# Patient Record
Sex: Male | Born: 1980 | Race: White | Hispanic: No | Marital: Married
Health system: Midwestern US, Community
[De-identification: ages and names within clinical notes are randomized; demographics above are authoritative.]

## PROBLEM LIST (undated history)

## (undated) DIAGNOSIS — J302 Other seasonal allergic rhinitis: Secondary | ICD-10-CM

## (undated) DIAGNOSIS — F1020 Alcohol dependence, uncomplicated: Secondary | ICD-10-CM

## (undated) DIAGNOSIS — I10 Essential (primary) hypertension: Secondary | ICD-10-CM

## (undated) DIAGNOSIS — K219 Gastro-esophageal reflux disease without esophagitis: Secondary | ICD-10-CM

## (undated) DIAGNOSIS — I471 Supraventricular tachycardia, unspecified: Secondary | ICD-10-CM

## (undated) HISTORY — PX: TONSILLECTOMY: SUR1361

## (undated) HISTORY — DX: Essential (primary) hypertension: I10

## (undated) HISTORY — PX: ADENOIDECTOMY: SUR15

## (undated) HISTORY — DX: Alcohol dependence, uncomplicated: F10.20

## (undated) HISTORY — PX: HERNIA REPAIR: SHX51

## (undated) HISTORY — PX: WISDOM TOOTH EXTRACTION: SHX21

---

## 2008-06-10 ENCOUNTER — Emergency Department (HOSPITAL_COMMUNITY): Admission: EM | Admit: 2008-06-10 | Discharge: 2008-06-10 | Payer: Self-pay | Admitting: Family Medicine

## 2009-05-31 ENCOUNTER — Ambulatory Visit: Payer: Self-pay | Admitting: Family Medicine

## 2009-05-31 LAB — CONVERTED CEMR LAB
Blood in Urine, dipstick: NEGATIVE
Glucose, Urine, Semiquant: NEGATIVE
Ketones, urine, test strip: NEGATIVE
Specific Gravity, Urine: <1.005

## 2009-12-21 ENCOUNTER — Ambulatory Visit: Payer: Self-pay | Admitting: Family Medicine

## 2009-12-21 DIAGNOSIS — G4700 Insomnia, unspecified: Secondary | ICD-10-CM

## 2009-12-21 DIAGNOSIS — R0989 Other specified symptoms and signs involving the circulatory and respiratory systems: Secondary | ICD-10-CM

## 2009-12-21 DIAGNOSIS — R0609 Other forms of dyspnea: Secondary | ICD-10-CM

## 2010-01-22 ENCOUNTER — Ambulatory Visit: Payer: Self-pay | Admitting: Family Medicine

## 2010-01-22 DIAGNOSIS — Z87891 Personal history of nicotine dependence: Secondary | ICD-10-CM

## 2010-04-03 NOTE — Assessment & Plan Note (Signed)
Summary: COLD   Vital Signs:  Patient Profile:   30 Years Old Male CC:      Headache, nausea, body aches, swollen lymph nodes in groin x 3 days Height:     70 inches Weight:      185 pounds O2 Sat:      99 % O2 treatment:    Room Air Temp:     99.4 degrees F oral Pulse rate:   87 / minute Pulse rhythm:   regular Resp:     12 per minute BP sitting:   134 / 75  (right arm) Cuff size:   regular  Vitals Entered By: Avel Sensor, CMA                  Prior Medication List:  No prior medications documented  Current Allergies: No known allergies History of Present Illness Chief Complaint: Headache, nausea, body aches, swollen lymph nodes in groin x 3 days History of Present Illness: As above. Every thing started on Monday night w/body aches and chills. Slept extra felt better and then got worse as the day progressed w/body aches lower body achey and headaches.  Current Problems: INFLUENZA (ICD-487.8) VIRAL INFECTION (ICD-079.99) VIRAL INFECTION (ICD-079.99)   Current Meds TAMIFLU 75 MG CAPS (OSELTAMIVIR PHOSPHATE) Take one capsule by mouth twice a day PROMETHAZINE HCL 25 MG  TABS (PROMETHAZINE HCL) sig 1 by mouth q6-8hrs prn MOBIC 7.5 MG TABS (MELOXICAM) sig 1 by mouth q day  REVIEW OF SYSTEMS Constitutional Symptoms       Complains of fever, chills, and fatigue.     Denies night sweats, weight loss, and weight gain.  Eyes       Denies change in vision, eye pain, eye discharge, glasses, contact lenses, and eye surgery. Ear/Nose/Throat/Mouth       Denies hearing loss/aids, change in hearing, ear pain, ear discharge, dizziness, frequent runny nose, frequent nose bleeds, sinus problems, sore throat, hoarseness, and tooth pain or bleeding.  Respiratory       Denies dry cough, productive cough, wheezing, shortness of breath, asthma, bronchitis, and emphysema/COPD.  Cardiovascular       Denies murmurs, chest pain, and tires easily with exhertion.    Gastrointestinal  Complains of stomach pain, nausea/vomiting, and constipation.      Denies diarrhea, blood in bowel movements, and indigestion.      Comments: nausea but no vomiting Genitourniary       Denies painful urination, kidney stones, and loss of urinary control. Neurological       Denies paralysis, seizures, and fainting/blackouts. Musculoskeletal       Complains of muscle pain, joint pain, and joint stiffness.      Denies decreased range of motion, redness, swelling, muscle weakness, and gout.  Skin       Denies bruising, unusual mles/lumps or sores, and hair/skin or nail changes.  Psych       Denies mood changes, temper/anger issues, anxiety/stress, speech problems, depression, and sleep problems.  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 Mother, Heathy Father, Diabetic  Social History: Reviewed history and no changes required. 1/2 ppd smoker, 15 yrs ETOH-yes No [employer], [employer] Physical Exam General appearance: well developed, well nourished, mild distress Head: normocephalic, atraumatic  Ears: normal, no lesions or deformities Nasal: swollen red turbinates with congestion Oral/Pharynx: tongue normal, posterior pharynx without erythema or exudate Neck: supple,anterior lymphadenopathy present Abdomen: soft, non-tender without obvious organomegaly Extremities: inguinal adenopathy tenderness pulses intact Skin: no obvious rashes or lesions MSE: oriented to time, place, and person U/A neg flu neg Assessment New Problems: INFLUENZA (ICD-487.8) VIRAL INFECTION (ICD-079.99) VIRAL INFECTION (ICD-079.99)  flu like syndrome neg flu test  Patient  Education: Patient and/or caregiver instructed in the following: rest fluids and Tylenol.  Plan New Medications/Changes: MOBIC 7.5 MG TABS (MELOXICAM) sig 1 by mouth q day  #20 x 0, 05/31/2009, Hassan Rowan MD PROMETHAZINE HCL 25 MG  TABS (PROMETHAZINE HCL) sig 1 by mouth q6-8hrs prn  #12 x 0, 05/31/2009, Hassan Rowan MD TAMIFLU 75 MG CAPS (OSELTAMIVIR PHOSPHATE) Take one capsule by mouth twice a day  #10 x 0, 05/31/2009, Hassan Rowan MD  New Orders: UA Dipstick w/o Micro (manual) [81002] Flu A+B [87400] New Patient Level III [id number] Planning Comments:   as below  Follow Up: Follow up in 2-3 days if no improvement, Follow up on an as needed basis, Follow up with Primary Physician Work/School Excuse: Return to work/school in 3 days  The patient and/or caregiver has been counseled thoroughly with regard to medications prescribed including dosage, schedule, interactions, rationale for use, and possible side effects and they verbalize understanding.  Diagnoses and expected course of recovery discussed and will return if not improved as expected or if the condition worsens. Patient and/or caregiver verbalized understanding.  Prescriptions: MOBIC 7.5 MG TABS (MELOXICAM) sig 1 by mouth q day  #20 x 0   Entered and Authorized by:   Hassan Rowan MD   Signed by:   Hassan Rowan MD on 05/31/2009   Method used:   Print then Give to Patient   RxID:   [id number] PROMETHAZINE HCL 25 MG  TABS (PROMETHAZINE HCL) sig 1 by mouth q6-8hrs prn  #12 x 0   Entered and Authorized by:   Hassan Rowan MD   Signed by:   Hassan Rowan MD on 05/31/2009   Method used:   Print then Give to Patient   RxID:   [id number] TAMIFLU 75 MG CAPS (OSELTAMIVIR PHOSPHATE) Take one capsule by mouth twice a day  #10 x 0   Entered and Authorized by:   Hassan Rowan MD   Signed by:   Hassan Rowan MD on 05/31/2009   Method used:   Print then Give to Patient   RxID:   [id number]   Patient Instructions: 1)  Even though flu  test is negative will treat this as flu. 2)  Please schedule a follow-up appointment as needed. 3)  Please schedule an appointment with your primary doctor in :3-4 days if not better 4)  Tobacco is very bad for your health and your loved ones! You Should stop smoking!. 5)  Stop Smoking Tips: Choose a Quit date. Cut down before the Quit date. decide what you will do as a substitute when you feel the urge to smoke(gum,toothpick,exercise). 6)  Recommended remaining out of work for next 3 days return 06/02/2009  Laboratory Results   Urine Tests  Date/Time Received: May 31, 2009 1:05 PM  Date/Time Reported: May 31, 2009 1:05 PM   Routine Urinalysis   Color: yellow Appearance: Clear Glucose: negative   (Normal Range: Negative) Bilirubin: negative   (Normal Range: Negative) Ketone: negative   (Normal Range: Negative) Spec. Gravity: <1.005   (Normal Range: 1.003-1.035) Blood: negative   (  Normal Range: Negative) pH: 6.5   (Normal Range: 5.0-8.0) Protein: negative   (Normal Range: Negative) Urobilinogen: 1.0   (Normal Range: 0-1) Nitrite: negative   (Normal Range: Negative) Leukocyte Esterace: negative   (Normal Range: Negative)

## 2010-04-03 NOTE — Assessment & Plan Note (Signed)
Summary: NOV: snoring, dry throat   Vital Signs:  Patient profile:   30 year old male Height:      70 inches Weight:      191 pounds BMI:     27.50 Pulse rate:   80 / minute BP sitting:   130 / 77  (right arm) Cuff size:   regular  Vitals Entered By: Avon Gully CMA, Duncan Dull) (December 21, 2009 10:32 AM) CC: NP-est care   CC:  NP-est care.  History of Present Illness: Since quit smoking has alot of trouble sleepiing.  Roof of his mouth is very dry. Snores every night.  No breathholding. No AM HA.  Has happened periodically throughout his life. Started with quitting smoking. No excess fatigue or daytime sleepiness. Taking Zyrtec only for the lat week for a cold.  .Says it is only teh back of roof of his mouth. No sig congestions or breathing thourgh his mouth. NO alleregies. Say smoking a cig is the only thinks that helps his snoring adn his dry mouth sensation. No famly hx of sjogrens. Deneis any dry eye hx.    Habits & Providers  Alcohol-Tobacco-Diet     Alcohol drinks/day: <1     Tobacco Status: quit     Year Quit: 2010  Exercise-Depression-Behavior     Does Patient Exercise: yes     STD Risk: never     Drug Use: never     Seat Belt Use: always  Allergies: No Known Drug Allergies  Past History:  Past Medical History: HTN, DM, alcoholism  Family History: Mother, Jack Quarto, smoker Father - Diabetic, HTN, alcoholism.   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 Has one dhild.  1/2 ppd smoker, 15 yrs. Quit smoking 11/23/2008 ETOH-yes No [employer], [address] Former Smoker 6 caffeinated drinks daily.  Smoking Status:  quit Does Patient Exercise:  yes STD Risk:  never Drug Use:  never Seat Belt Use:  always  Review of Systems       No fever/sweats/weakness, unexplained weight loss/gain.  No vison changes.  No difficulty hearing/ringing in ears, hay fever/allergies.  No chest pain/discomfort, palpitations.  No Br  lump/nipple discharge.  No cough/wheeze.  No blood in BM, nausea/vomiting/diarrhea.  No nighttime urination, leaking urine, unusual vaginal bleeding, discharge (penis or vagina).  No muscle/joint pain. No rash, change in mole.  No HA, memory loss.  No anxiety, sleep d/o, depression.  No easy bruising/bleeding, unexplained lump   Physical Exam  General:  Well-developed,well-nourished,in no acute distress; alert,appropriate and cooperative throughout examination Head:  Normocephalic and atraumatic without obvious abnormalities. No apparent alopecia or balding. Eyes:  No corneal or conjunctival inflammation noted. EOMI. Perrla.  Ears:  External ear exam shows no significant lesions or deformities.  Otoscopic examination reveals clear canals, tympanic membranes are intact bilaterally without bulging, retraction, inflammation or discharge. Hearing is grossly normal bilaterally. Nose:  External nasal examination shows no deformity or inflammation.  Mouth:  Oral mucosa and oropharynx without lesions or exudates.  Teeth in good repair. Soft palate looks very moist.  Neck:  No deformities, masses, or tenderness noted. NO TM.  Lungs:  Normal respiratory effort, chest expands symmetrically. Lungs are clear to auscultation, no crackles or wheezes. Heart:  Normal rate and regular rhythm. S1 and S2 normal without gallop, murmur, click, rub or other extra sounds. Skin:  no rashes.   Cervical Nodes:  No lymphadenopathy noted Psych:  Cognition and judgment appear intact. Alert and cooperative with  normal attention span and concentration. No apparent delusions, illusions, hallucinations   Impression & Recommendations:  Problem # 1:  SNORING (ICD-786.09) Could be a sign of OSA but no AM HA or daytime excessive sleepiness.  But snoring is almost nightly. He says it is not positions and he has had his tonsil sremoved. I would suspet the snoring is causing the sensation over teh soft palate as the tissues themselve  are very moist wito no lesions, etc.  Dsicussed could consider a sleep studay or referral to sleep med specialist. For short term - tiral of nasal steroid for one mont h and then return to clinic and see if helping or not.  Encourage him to not start smoking again.   Problem # 2:  INSOMNIA (ICD-780.52) Unclear if related to his snoring or not. Snoring may be waking him up but not sure. Also he does shift work working in [employer] so likely this and his caffeine have a rgreat effect. Dsicussed sleep hygiene. Can also consider a sleep aid if hygeiene alone not helping.   Complete Medication List: 1)  Flonase 50 Mcg/act Susp (Fluticasone propionate) .... 2 sprays in each nostril once a day.  Patient Instructions: 1)  Try the nasal spray for one month and then follow up.  Prescriptions: FLONASE 50 MCG/ACT SUSP (FLUTICASONE PROPIONATE) 2 sprays in each nostril once a day.  #1 x 1   Entered and Authorized by:   Nani Gasser MD   Signed by:   Nani Gasser MD on 12/21/2009   Method used:   [facility] to        [phone][facility] ([facility]ail[facility]       [address]       [place], [place]  [address]       Ph: [phone] or [phone]       Fax: [phone]   RxID:   [id number]    Orders Added: 1)  New Patient Level III [99203]   Immunization History:  Influenza Immunization History:    Influenza:  historical (12/11/2009)   Immunization History:  Influenza Immunization History:    Influenza:  Historical (12/11/2009)   Immunization History:  Influenza Immunization History:    Influenza:  historical (12/11/2009)

## 2010-04-03 NOTE — Assessment & Plan Note (Signed)
Summary: CPE   Vital Signs:  Patient profile:   30 year old male Height:      70 inches Weight:      191 pounds Pulse rate:   83 / minute BP sitting:   135 / 78  (right arm) Cuff size:   regular  Vitals Entered By: Avon Gully CMA, Duncan Dull) (January 22, 2010 10:09 AM) CC: CPE, Hypertension Management   CC:  CPE and Hypertension Management.  History of Present Illness: CPE.  Dong well. He had quit smoking for about a months but then started again about 2 weeks ago. Then stopped again about 2 days ago. He thinks he may need help quitting smoking and would like to discuss those options.   He was on teh flonase for snoring. Didn't really help and started causing nosebleeds so stopped it.   Hypertension History:      Negative major cardiovascular risk factors include male age less than 58 years old and non-tobacco-user status.     Current Medications (verified): 1)  Flonase 50 Mcg/act Susp (Fluticasone Propionate) .... 2 Sprays in Each Nostril Once A Day.  Allergies (verified): No Known Drug Allergies  Comments:  Nurse/Medical Assistant: The patient's medications and allergies were reviewed with the patient and were updated in the Medication and Allergy Lists. Avon Gully CMA, Duncan Dull) (January 22, 2010 10:09 AM)  Past History:  Past Medical History: Last updated: 12/21/2009 HTN, DM, alcoholism  Family History: Last updated: 12/21/2009 Mother, Heathy, smoker Father - Diabetic, HTN, alcoholism.   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  1/2 ppd smoker, 15 yrs. Quit smoking 11/23/2008 ETOH-yes No [employer], [address] Former Smoker 6 caffeinated drinks daily.   Past Surgical History: Inguinal herniorrhaphy Tonsillectomy and adenoids  Review of Systems  The patient denies anorexia, fever, weight loss, weight gain, vision loss, decreased hearing, hoarseness, chest  pain, syncope, dyspnea on exertion, peripheral edema, prolonged cough, headaches, hemoptysis, abdominal pain, melena, hematochezia, severe indigestion/heartburn, hematuria, incontinence, genital sores, muscle weakness, suspicious skin lesions, transient blindness, difficulty walking, depression, unusual weight change, abnormal bleeding, and enlarged lymph nodes.    Physical Exam  General:  Well-developed,well-nourished,in no acute distress; alert,appropriate and cooperative throughout examination Head:  Normocephalic and atraumatic without obvious abnormalities. No apparent alopecia or balding. Eyes:  No corneal or conjunctival inflammation noted. EOMI. Perrla.  Ears:  External ear exam shows no significant lesions or deformities.  Otoscopic examination reveals clear canals, tympanic membranes are intact bilaterally without bulging, retraction, inflammation or discharge. Hearing is grossly normal bilaterally. Nose:  External nasal examination shows no deformity or inflammation. Nasal mucosa are pink and moist without lesions or exudates. Mouth:  Oral mucosa and oropharynx without lesions or exudates.  Teeth in good repair. Neck:  No deformities, masses, or tenderness noted. Chest Wall:  No deformities, masses, tenderness or gynecomastia noted. Lungs:  Normal respiratory effort, chest expands symmetrically. Lungs are clear to auscultation, no crackles or wheezes. Heart:  Normal rate and regular rhythm. S1 and S2 normal without gallop, murmur, click, rub or other extra sounds. Abdomen:  Bowel sounds positive,abdomen soft and non-tender without masses, organomegaly or hernias noted. Msk:  No deformity or scoliosis noted of thoracic or lumbar spine.   Pulses:  R and L carotid,radial,dorsalis pedis and posterior tibial pulses are full and equal bilaterally Extremities:  No clubbing, cyanosis, edema, or deformity noted with normal full range of motion of all joints.   Neurologic:  No cranial nerve  deficits noted. Station and gait are normal. DTRs are symmetrical throughout. Sensory, motor and coordinative functions appear intact. Skin:  no rashes.   Cervical Nodes:  No lymphadenopathy noted Psych:  Cognition and judgment appear intact. Alert and cooperative with normal attention span and concentration. No apparent delusions, illusions, hallucinations   Impression & Recommendations:  Problem # 1:  PHYSICAL EXAMINATION (ICD-V70.0) Doing well. He has recently stoppeding drinking and smoking which is wonders.   Deu for screening labs Immunizations are uptodate.  Orders: T-Comprehensive Metabolic Panel [id number]) T-Lipid Profile [phone])  Problem # 2:  TOBACCO ABUSE (ICD-305.1) Discussed Chantix vx Wellbutrin. How meds works and possible side effects. He decided to use the wellbutrin.  Rx sent.   Complete Medication List: 1)  Budeprion Sr 150 Mg Xr12h-tab (Bupropion hcl) .... One a day for 3 days then increase to two times a day  Hypertension Assessment/Plan:      The patient's hypertensive risk group is category A: No risk factors and no target organ damage.  Today's blood pressure is 135/78.    Patient Instructions: 1)  Great jobs on improving your health. 2)  Can start the buproprion to help you stop smoking.   3)  Call if any concerns 4)  You can go to the lab anytime Monday through Friday 8AM to 5PM, you need to fast for 8 hours.  Prescriptions: BUDEPRION SR 150 MG XR12H-TAB (BUPROPION HCL) one a day for 3 days then increase to two times a day  #60 x 2   Entered and Authorized by:   Nani Gasser MD   Signed by:   Nani Gasser MD on 01/22/2010   Method used:   [facility] to        [facility] [phone][facility] (retail)       [address] [place], [place]  [address]       Ph: [phone] or [phone]       Fax: [phone]   RxID:   [id number]    Orders Added: 1)  Est. Patient age 29-39 [57] 2)  T-Comprehensive Metabolic Panel  [[id number]] 3)  T-Lipid Profile [phone]   Immunization History:  Tetanus/Td Immunization History:    Tetanus/Td:  historical (01/02/2009)   Immunization History:  Tetanus/Td Immunization History:    Tetanus/Td:  Historical (01/02/2009)     Immunization History:  Tetanus/Td Immunization History:    Tetanus/Td:  historical (01/02/2009)

## 2010-04-03 NOTE — Letter (Signed)
Summary: Out of Work  [facility]  [address]   [place], [place] [address]   Phone: [phone]  Fax: [phone]    May 31, 2009   Employee:  Tsutomu J DRESCHSLER    To Whom It May Concern:   For Medical reasons, please excuse the above named employee from work for the following dates:  Start:   05/31/2009  Return :   06/03/2009  If you need additional information, please feel free to contact our office.         Sincerely,    Hassan Rowan MD

## 2010-06-04 ENCOUNTER — Other Ambulatory Visit (INDEPENDENT_AMBULATORY_CARE_PROVIDER_SITE_OTHER): Payer: 59

## 2010-06-04 ENCOUNTER — Ambulatory Visit (INDEPENDENT_AMBULATORY_CARE_PROVIDER_SITE_OTHER): Payer: 59 | Admitting: Family Medicine

## 2010-06-04 DIAGNOSIS — N390 Urinary tract infection, site not specified: Secondary | ICD-10-CM

## 2010-06-04 LAB — POCT URINALYSIS DIPSTICK
Bilirubin, UA: NEGATIVE
Glucose, UA: NEGATIVE
Ketones, UA: NEGATIVE
Leukocytes, UA: NEGATIVE

## 2010-06-04 NOTE — Progress Notes (Signed)
  Subjective:    Patient ID: Derek Berry, male    DOB: Jun 26, 1980, 30 y.o.   MRN: [id number]  HPI    Review of Systems     Objective:   Physical Exam        Assessment & Plan:  Pt referred to UC since UA is neg and having pelvi pain.

## 2010-06-04 NOTE — Progress Notes (Signed)
Addended by: Nani Gasser on: 06/04/2010 12:58 PM   Modules accepted: Orders

## 2010-06-04 NOTE — Progress Notes (Signed)
Addended by: Avon Gully on: 06/04/2010 04:55 PM   Modules accepted: Orders

## 2010-06-05 ENCOUNTER — Telehealth: Payer: Self-pay | Admitting: Family Medicine

## 2010-06-05 LAB — GC/CHLAMYDIA PROBE AMP, URINE
Chlamydia, Swab/Urine, PCR: NEGATIVE
GC Probe Amp, Urine: NEGATIVE

## 2010-06-05 NOTE — Telephone Encounter (Signed)
Call pt: Urine GC and chalm is neg. If he is not feeling better we can put him on the schedule for today (wed).

## 2010-06-06 LAB — URINE CULTURE
Colony Count: NO GROWTH
Organism ID, Bacteria: NO GROWTH

## 2010-06-07 ENCOUNTER — Telehealth: Payer: Self-pay | Admitting: Family Medicine

## 2010-06-07 NOTE — Telephone Encounter (Signed)
Called and left message on pt vm with dr. Samella Parr

## 2010-06-07 NOTE — Telephone Encounter (Signed)
Call pt: Urine cx is neg.  Is he still having pain. If so then needs appt.

## 2010-06-12 NOTE — Telephone Encounter (Signed)
Have tried to call this pt several times but i keep getting a busy signal.

## 2010-06-19 NOTE — Telephone Encounter (Signed)
Lets just mail him a letter with the results and note that we tried to contact him multiple times.

## 2010-06-19 NOTE — Telephone Encounter (Signed)
I have attempted to contact this patient by phone with the following results: left message to return my call on answering machine (home).  

## 2010-09-28 ENCOUNTER — Encounter: Payer: Self-pay | Admitting: Family Medicine

## 2010-09-28 ENCOUNTER — Inpatient Hospital Stay (INDEPENDENT_AMBULATORY_CARE_PROVIDER_SITE_OTHER)
Admission: RE | Admit: 2010-09-28 | Discharge: 2010-09-28 | Disposition: A | Discharge status: Home / Self Care | Payer: 59 | Source: Ambulatory Visit | Attending: Family Medicine | Admitting: Family Medicine

## 2010-09-28 DIAGNOSIS — J209 Acute bronchitis, unspecified: Secondary | ICD-10-CM

## 2010-09-28 DIAGNOSIS — S239XXA Sprain of unspecified parts of thorax, initial encounter: Secondary | ICD-10-CM

## 2010-09-28 DIAGNOSIS — M7520 Bicipital tendinitis, unspecified shoulder: Secondary | ICD-10-CM

## 2010-12-12 ENCOUNTER — Ambulatory Visit
Admission: RE | Admit: 2010-12-12 | Discharge: 2010-12-12 | Disposition: A | Discharge status: Home / Self Care | Payer: 59 | Source: Ambulatory Visit | Attending: Family Medicine | Admitting: Family Medicine

## 2010-12-12 ENCOUNTER — Other Ambulatory Visit: Payer: Self-pay | Admitting: Family Medicine

## 2010-12-12 ENCOUNTER — Encounter: Payer: Self-pay | Admitting: Family Medicine

## 2010-12-12 ENCOUNTER — Inpatient Hospital Stay (INDEPENDENT_AMBULATORY_CARE_PROVIDER_SITE_OTHER)
Admission: RE | Admit: 2010-12-12 | Discharge: 2010-12-12 | Disposition: A | Discharge status: Home / Self Care | Payer: 59 | Source: Ambulatory Visit | Attending: Family Medicine | Admitting: Family Medicine

## 2010-12-12 DIAGNOSIS — R51 Headache: Secondary | ICD-10-CM

## 2010-12-12 DIAGNOSIS — J0100 Acute maxillary sinusitis, unspecified: Secondary | ICD-10-CM | POA: Insufficient documentation

## 2010-12-15 ENCOUNTER — Telehealth (INDEPENDENT_AMBULATORY_CARE_PROVIDER_SITE_OTHER): Payer: Self-pay | Admitting: Emergency Medicine

## 2011-02-04 NOTE — Letter (Signed)
Summary: Out of Work  [facility]  [address]   [place], [place] [address]   Phone: [phone]  Fax: [phone]    December 12, 2010   Employee:  Jelan J Mesta    To Whom It May Concern:   For Medical reasons, please excuse the above named employee from work for the following dates:  Start:   12/12/2010  Return 12/13/2010:    If you need additional information, please feel free to contact our office.         Sincerely,    Hassan Rowan MD

## 2011-02-04 NOTE — Progress Notes (Signed)
Summary: Cough/Shoulder (rm 4)   Vital Signs:  Patient Profile:   30 Years Old Male CC:      right shoulder pain and cough, hoarse x 1 week Height:     70 inches Weight:      198 pounds O2 Sat:      96 % O2 treatment:    Room Air Temp:     98.5 degrees F oral Pulse rate:   91 / minute Resp:     16 per minute BP sitting:   120 / 76  (left arm) Cuff size:   large  Pt. in pain?   yes    Location:   right shoulder  Vitals Entered By: Lajean Saver RN (September 28, 2010 3:37 PM)                   Updated Prior Medication List: OMEPRAZOLE 40 MG CPDR (OMEPRAZOLE)   Current Allergies: ! SULFAHistory of Present Illness Chief Complaint: right shoulder pain and cough, hoarse x 1 week History of Present Illness:  Subjective:  Patient presents with two problems: 1)  He begain developing mild URI symptoms about 8 days ago with scratchy throat and cough but no sinus congestion.  The cough has gradually worsened, and is worse at night.  No shortness of breath or pleuritic pain.  No fevers, chills, and sweats.  He states that he smokes only occasionally.  He states that his girlfriend developed a similar cough about one month ago and was just diagnosed with pneumonia. 2)  He complains of gradual soreness in the right shoulder and shoulder blade area for about a week, worse with abduction of his right arm overhead.  He recalls no trauma to the area, or change in activities.  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  Eyes       Denies change in vision, eye pain, eye discharge, glasses, contact lenses, and eye surgery. Ear/Nose/Throat/Mouth       Complains of sinus problems and hoarseness.      Denies hearing loss/aids, change in hearing, ear pain, ear discharge, dizziness, frequent runny nose, frequent nose bleeds, sore throat, and  tooth pain or bleeding.      Comments: congestion Respiratory       Complains of productive cough.      Denies dry cough, wheezing, shortness of breath, asthma, bronchitis, and emphysema/COPD.  Cardiovascular       Complains of chest pain.      Denies murmurs and tires easily with exhertion.      Comments: soreness with cough   Gastrointestinal       Denies stomach pain, nausea/vomiting, diarrhea, constipation, blood in bowel movements, and indigestion. Genitourniary       Denies painful urination, kidney stones, and loss of urinary control. Neurological       Denies paralysis, seizures, and fainting/blackouts. Musculoskeletal       Complains of muscle pain, joint pain, joint stiffness, and decreased range of motion.      Denies redness, swelling, muscle weakness, and gout.      Comments: right shoulder Skin       Denies bruising, unusual mles/lumps or sores, and hair/skin or nail changes.  Psych       Denies mood changes, temper/anger issues, anxiety/stress, speech problems, depression, and sleep problems. Other  Comments: Taken Mucinex   Past History:  Past Medical History: Reviewed history from 12/21/2009 and no changes required. HTN, DM, alcoholism  Past Surgical History: Reviewed history from 01/22/2010 and no changes required. Inguinal herniorrhaphy Tonsillectomy and adenoids  Family History: Reviewed history from 12/21/2009 and no changes required. Mother, Jack Quarto, smoker Father - Diabetic, HTN, alcoholism.   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    Objective:  Appearance:  Patient appears healthy, stated age, and in no acute distress  Eyes:  Pupils are equal, round, and reactive to light and accomodation.  Extraocular movement is intact.  Conjunctivae are not inflamed.  Ears:  Canals normal.   Tympanic membranes normal.   Nose:  Mildly congested turbinates.  No sinus tenderness  Pharynx:  Normal  Neck:  Supple.  No adenopathy is present.  Lungs:  Clear to auscultation.  Breath sounds are equal.  Heart:  Regular rate and rhythm without murmurs, rubs, or gallops.  Abdomen:  Nontender without masses or hepatosplenomegaly.  Bowel sounds are present.  No CVA or flank tenderness.  Extremities:  No edema.  Right shoulder:  Full range of motion.  There is distinct tenderness over the insertion of both biceps tendons anteriorly, worse with resisted flexion of the elbow.  There is also tenderness along the medial edge of the right scapula.  Distal neurovascular intact  Assessment New Problems: BACK STRAIN, THORACIC (ICD-847.1) BICEPS TENDINITIS, RIGHT (ICD-726.12) ACUTE BRONCHITIS (ICD-466.0)  ? PERTUSSIS RIGHT SHOULDER BICEPS TENDONITIS AND RHOMBOID STRAIN/INFLAMMATION  Plan New Medications/Changes: BENZONATATE 200 MG CAPS (BENZONATATE) One by mouth hs as needed cough  #12 x 0, 09/28/2010, Donna Christen MD AZITHROMYCIN 250 MG TABS (AZITHROMYCIN) Two tabs by mouth on day 1, then 1 tab daily on days 2 through 5  #6 tabs x 0, 09/28/2010, Donna Christen MD  New Orders: Est. Patient Level IV [16109] Planning Comments:   Will treat as a bronchitis.  Begin Z-pack, expectorant, cough suppressant at bedtime.  Increase fluid intake Followup with PCP if not improving 7 to 10 days  Begin shoulder range of motion and stretching exercises ([employer] information and instruction patient handouts given on Biceps tendonitis and rhomboid strain).   Ibuprofen 200mg , 4 tabs every 8 hours with food.  Followup with [facility] if not improved in two weeks.   The patient and/or caregiver has been counseled thoroughly with regard to medications prescribed including dosage, schedule, interactions, rationale for use, and possible side effects and they verbalize understanding.  Diagnoses and  expected course of recovery discussed and will return if not improved as expected or if the condition worsens. Patient and/or caregiver verbalized understanding.  Prescriptions: BENZONATATE 200 MG CAPS (BENZONATATE) One by mouth hs as needed cough  #12 x 0   Entered and Authorized by:   Donna Christen MD   Signed by:   Donna Christen MD on 09/28/2010   Method used:   Print then Give to Patient   RxID:   [id number] AZITHROMYCIN 250 MG TABS (AZITHROMYCIN) Two tabs by mouth on day 1, then 1 tab daily on days 2 through 5  #6 tabs x 0   Entered and Authorized by:   Donna Christen MD   Signed by:   Donna Christen MD on 09/28/2010   Method used:   Print then Give to Patient   RxID:   [id number]   Patient Instructions:  1)  Take Mucinex (guaifenesin)  twice daily for congestion and cough 2)  Increase fluid intake 3)  Take Ibuprofen 200mg , 4 tabs every 8 hours with food for several days. 4)  Followup with [facility] if not improved in two weeks.    Orders Added: 1)  Est. Patient Level IV [78469]

## 2011-02-04 NOTE — Letter (Signed)
Summary: Out of Work  [facility]  [address]   [place], [place] [address]   Phone: [phone]  Fax: [phone]    September 28, 2010   Employee:  Bonny J Pedrosa    To Whom It May Concern:  Derek Berry was evaluated in our clinic this afternoon for bronchitis and right shoulder pain.  If you need additional information, please feel free to contact our office.         Sincerely,    Donna Christen MD

## 2011-02-04 NOTE — Progress Notes (Signed)
Summary: head pain rm 5   Vital Signs:  Patient Profile:   30 Years Old Male CC:      headache x 1 day Height:     70 inches Weight:      204.75 pounds O2 Sat:      99 % O2 treatment:    Room Air Temp:     98.4 degrees F oral Pulse rate:   74 / minute Resp:     18 per minute BP sitting:   148 / 83  (left arm) Cuff size:   regular  Pt. in pain?   yes    Location:   head    Intensity:   2-3    Type:       stinging  Vitals Entered By: Clemens Catholic LPN (December 12, 2010 2:12 PM)                   Prior Medication List:  OMEPRAZOLE 40 MG CPDR (OMEPRAZOLE)  AZITHROMYCIN 250 MG TABS (AZITHROMYCIN) Two tabs by mouth on day 1, then 1 tab daily on days 2 through 5 BENZONATATE 200 MG CAPS (BENZONATATE) One by mouth hs as needed cough   Updated Prior Medication List: OMEPRAZOLE 40 MG CPDR (OMEPRAZOLE)   Current Allergies (reviewed today): ! SULFAHistory of Present Illness Chief Complaint: headache x 1 day History of Present Illness: Patient had a difficult/painful sneeze yesterday and has developed L frontal headache since then. He states it is as if you were under water and went up your nose. The pain is a 4-5/10 but persistent since yesterday.   Current Problems: ACUTE MAXILLARY SINUSITIS (ICD-461.0) HEADACHE (ICD-784.0) TOBACCO ABUSE (ICD-305.1) PHYSICAL EXAMINATION (ICD-V70.0) INSOMNIA (ICD-780.52) SNORING (ICD-786.09)   Current Meds OMEPRAZOLE 40 MG CPDR (OMEPRAZOLE)  AUGMENTIN 875-125 MG TABS (AMOXICILLIN-POT CLAVULANATE) 1 by mouth 2 times daily ALLEGRA-D ALLERGY & CONGESTION 180-240 MG XR24H-TAB (FEXOFENADINE-PSEUDOEPHEDRINE) 1 by mouth q day FLONASE 50 MCG/ACT SUSP (FLUTICASONE PROPIONATE) 2 puff each nostril q day HYDROCODONE-ACETAMINOPHEN 5-325 MG TABS (HYDROCODONE-ACETAMINOPHEN) 1 by mouth q8 hrs as needed for pain  REVIEW OF SYSTEMS Constitutional Symptoms      Denies fever, chills, night sweats, weight loss, weight gain, and fatigue.  Eyes   Denies change in vision, eye pain, eye discharge, glasses, contact lenses, and eye surgery. Ear/Nose/Throat/Mouth       Denies hearing loss/aids, change in hearing, ear pain, ear discharge, dizziness, frequent runny nose, frequent nose bleeds, sinus problems, sore throat, hoarseness, and tooth pain or bleeding.  Respiratory       Denies dry cough, productive cough, wheezing, shortness of breath, asthma, bronchitis, and emphysema/COPD.  Cardiovascular       Denies murmurs, chest pain, and tires easily with exhertion.    Gastrointestinal       Denies stomach pain, nausea/vomiting, diarrhea, constipation, blood in bowel movements, and indigestion. Genitourniary       Denies painful urination, kidney stones, and loss of urinary control. Neurological       Complains of headaches.      Denies paralysis, seizures, and fainting/blackouts. Musculoskeletal       Denies muscle pain, joint pain, joint stiffness, decreased range of motion, redness, swelling, muscle weakness, and gout.  Skin       Denies bruising, unusual mles/lumps or sores, and hair/skin or nail changes.  Psych       Denies mood changes, temper/anger issues, anxiety/stress, speech problems, depression, and sleep problems. Other Comments: pt states that he sneezed yesterday felt a sharp pain  in his and a "pop". Today he has a "stinging" HA on the LT frontal lobe area. he has taken IBF yesterday and Pain aid today @ 11:00.    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   Risk Factors: Alcohol Use: <1 (12/21/2009) Exercise: yes (12/21/2009)  Risk Factors: Smoking Status: quit (12/21/2009)  Past Medical History: Reviewed history from 12/21/2009 and no changes required. HTN, DM, alcoholism  Past Surgical History: Reviewed  history from 01/22/2010 and no changes required. Inguinal herniorrhaphy Tonsillectomy and adenoids  Family History: Reviewed history from 12/21/2009 and no changes required. Mother, Jack Quarto, smoker Father - Diabetic, HTN, alcoholism.   Social History: Reviewed history from 09/28/2010 and no changes required. 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  Physical Exam General appearance: well developed, well nourished, no acute distress Head: normocephalic, atraumatic Eyes: conjunctivae and lids normal Pupils: equal, round, reactive to light Ears: normal, no lesions or deformities Nasal: pale, boggy, swollen nasal turbinates Oral/Pharynx: tongue normal, posterior pharynx without erythema or exudate Neck: neck supple,  trachea midline, no masses Neurological: grossly intact and non-focal Skin: no obvious rashes or lesions MSE: oriented to time, place, and person no tenderness over frontal or maxillary sinuses. Assessment Problems:   TOBACCO ABUSE (ICD-305.1) PHYSICAL EXAMINATION (ICD-V70.0) INSOMNIA (ICD-780.52) SNORING (ICD-786.09) New Problems: ACUTE MAXILLARY SINUSITIS (ICD-461.0) HEADACHE (ICD-784.0)   Patient Education: Patient and/or caregiver instructed in the following: rest fluids and Tylenol.  Plan New Medications/Changes: HYDROCODONE-ACETAMINOPHEN 5-325 MG TABS (HYDROCODONE-ACETAMINOPHEN) 1 by mouth q8 hrs as needed for pain  #12 x 0, 12/12/2010, Hassan Rowan MD FLONASE 50 MCG/ACT SUSP (FLUTICASONE PROPIONATE) 2 puff each nostril q day  #1 x 0, 12/12/2010, Hassan Rowan MD ALLEGRA-D ALLERGY & CONGESTION 180-240 MG XR24H-TAB (FEXOFENADINE-PSEUDOEPHEDRINE) 1 by mouth q day  #30 x 0, 12/12/2010, Hassan Rowan MD AUGMENTIN [phone] MG TABS (AMOXICILLIN-POT CLAVULANATE) 1 by mouth 2 times daily  #20 x 0, 12/12/2010, Hassan Rowan  MD  New Orders: T-CT Head w/o cm [70450] Est. Patient Level IV [04540] Follow Up: Follow up in 2-3 days if no improvement, Follow up on an as needed basis, Follow up with Primary Physician Work/School Excuse: Return to work/school tomorrow  The patient and/or caregiver has been counseled thoroughly with regard to medications prescribed including dosage, schedule, interactions, rationale for use, and possible side effects and they verbalize understanding.  Diagnoses and expected course of recovery discussed and will return if not improved as expected or if the condition worsens. Patient and/or caregiver verbalized understanding.  Prescriptions: HYDROCODONE-ACETAMINOPHEN 5-325 MG TABS (HYDROCODONE-ACETAMINOPHEN) 1 by mouth q8 hrs as needed for pain  #12 x 0   Entered and Authorized by:   Hassan Rowan MD   Signed by:   Hassan Rowan MD on 12/12/2010   Method used:   Printed then faxed to ...       [facility] [phone][facility] (retail)       [address] [place], [place]  [address]       Ph: [phone] or [phone]       Fax: [phone]   RxID:   [id number] FLONASE 50 MCG/ACT SUSP (FLUTICASONE PROPIONATE) 2 puff each nostril q day  #1 x 0  Entered and Authorized by:   Hassan Rowan MD   Signed by:   Hassan Rowan MD on 12/12/2010   Method used:   Printed then faxed to ...       [phone][facility] (retail)       [address] [place], [place]  [address]       Ph: [phone] or [phone]       Fax: [phone]   RxID:   [id number] ALLEGRA-D ALLERGY & CONGESTION 180-240 MG XR24H-TAB (FEXOFENADINE-PSEUDOEPHEDRINE) 1 by mouth q day  #30 x 0   Entered and Authorized by:   Hassan Rowan MD   Signed by:   Hassan Rowan MD on 12/12/2010   Method used:   Printed then faxed to ...       [facility] [phone][facility] (retail)       [address] [place], [place]  [address]       Ph: [phone] or [phone]       Fax: [phone]   RxID:   [id number] AUGMENTIN 875-125 MG  TABS (AMOXICILLIN-POT CLAVULANATE) 1 by mouth 2 times daily  #20 x 0   Entered and Authorized by:   Hassan Rowan MD   Signed by:   Hassan Rowan MD on 12/12/2010   Method used:   Printed then faxed to ...       [facility] [phone]* (retail)       [address] [place], [place]  [address]       Ph: [phone] or [phone]       Fax: [phone]   RxID:   [id number]   Patient Instructions: 1)  Take your antibiotic as prescribed until ALL of it is gone, but stop if you develop a rash or swelling and contact our office as soon as possible. 2)  Acute sinusitis symptoms for less than 10 days are not helped by antibiotics.Use warm moist compresses, and over the counter decongestants ( only as directed). Call if no improvement in 5-7 days, sooner if increasing pain, fever, or new symptoms. 3)  Recommended remaining out of work for today 4)  Please schedule a follow-up appointment as needed. 5)  Please schedule an appointment with your primary doctor in :5-14 days if not better  Orders Added: 1)  T-CT Head w/o cm [70450] 2)  Est. Patient Level IV [51025]

## 2011-02-04 NOTE — Telephone Encounter (Signed)
  Phone Note Outgoing Call   Call placed by: Lavell Islam RN,  December 15, 2010 12:56 PM Call placed to: Patient Summary of Call: Left message on voice mail inquiring about patient's condition; encouraged him to call with questions/concerns. Initial call taken by: Lavell Islam RN,  December 15, 2010 12:56 PM

## 2011-07-15 ENCOUNTER — Telehealth: Payer: Self-pay | Admitting: *Deleted

## 2011-07-15 MED ORDER — OMEPRAZOLE 40 MG PO CPDR: 40.0000 mg | DELAYED_RELEASE_CAPSULE | Freq: Two times a day (BID) | ORAL | Status: DC

## 2011-07-15 NOTE — Telephone Encounter (Signed)
Pt calls and wants a refill on his Omeprazole 40mg  delayed release caps taking 1 tab twice a day sent to [facility]. Has appt scheduled with you on 08/15/2011. Looked in old system and it is on the med list there.

## 2011-07-15 NOTE — Telephone Encounter (Signed)
Okay to refill, but please encourage him to try to start weaning down to one a day if tolerated.

## 2011-07-15 NOTE — Telephone Encounter (Signed)
Pt informed

## 2011-07-31 ENCOUNTER — Emergency Department
Admission: EM | Admit: 2011-07-31 | Discharge: 2011-07-31 | Disposition: A | Discharge status: Home / Self Care | Payer: 59 | Source: Home / Self Care | Attending: Family Medicine | Admitting: Family Medicine

## 2011-07-31 ENCOUNTER — Encounter: Payer: Self-pay | Admitting: *Deleted

## 2011-07-31 ENCOUNTER — Emergency Department
Admit: 2011-07-31 | Discharge: 2011-07-31 | Disposition: A | Discharge status: Home / Self Care | Payer: BC Managed Care – PPO | Attending: Family Medicine | Admitting: Family Medicine

## 2011-07-31 DIAGNOSIS — J029 Acute pharyngitis, unspecified: Secondary | ICD-10-CM

## 2011-07-31 DIAGNOSIS — M542 Cervicalgia: Secondary | ICD-10-CM

## 2011-07-31 DIAGNOSIS — M7521 Bicipital tendinitis, right shoulder: Secondary | ICD-10-CM

## 2011-07-31 DIAGNOSIS — M7918 Myalgia, other site: Secondary | ICD-10-CM

## 2011-07-31 DIAGNOSIS — M7520 Bicipital tendinitis, unspecified shoulder: Secondary | ICD-10-CM

## 2011-07-31 DIAGNOSIS — IMO0001 Reserved for inherently not codable concepts without codable children: Secondary | ICD-10-CM

## 2011-07-31 HISTORY — DX: Other seasonal allergic rhinitis: J30.2

## 2011-07-31 HISTORY — DX: Gastro-esophageal reflux disease without esophagitis: K21.9

## 2011-07-31 LAB — POCT CBC W AUTO DIFF (K'VILLE URGENT CARE)

## 2011-07-31 MED ORDER — MELOXICAM 15 MG PO TABS: 15.0000 mg | ORAL_TABLET | Freq: Every day | ORAL | Status: DC

## 2011-07-31 NOTE — ED Notes (Signed)
Pt c/o RT neck/shoulder pain x 6 months, no injury. He states that today he had the shakes around lunch time and an "empty feeling in his stomach". He has an appt with dr Linford Arnold on 07/15/11.

## 2011-07-31 NOTE — Discharge Instructions (Signed)
Apply ice pack to painful areas two or three times daily.  Begin range of motion and stretching exercises as per instructions sheets.

## 2011-07-31 NOTE — ED Provider Notes (Signed)
History     CSN: [id number]  Arrival date & time 07/31/11  1517   First MD Initiated Contact with Patient 07/31/11 1617      Chief Complaint  Patient presents with  . Shoulder Pain  . Neck Pain  . Shaking      HPI Comments: Patient presents with two problems: 1)  He complains of pain in his right lateral and posterior neck, associated with sensation of a right sore throat for several months.  He also has vague pain in his right upper back above scapula.  No radicular pain right arm.  He works out with [employer] regularly but recalls no injury.  He has pain at night. 2)  Today at about 11:30 AM at work he developed "shakes" and felt as if he needed to eat.  After eating some fast food, the symptoms continued throughout the day.  He had had his usual breakfast of cereal and milk about 10 AM today.  He reports that he is frequently thirsty and drinks water throughout the day.  Also has increased urination.  His father has type I diabetes.  Patient is a 31 y.o. male presenting with pharyngitis. The history is provided by the patient.  Sore Throat This is a chronic problem. Episode onset: several months. The problem occurs constantly. The problem has not changed since onset.Pertinent negatives include no headaches. Associated symptoms comments: Right neck, shoulder, and upper back pain. The symptoms are aggravated by nothing. The symptoms are relieved by nothing.    Past Medical History  Diagnosis Date  . Seasonal allergies   . GERD (gastroesophageal reflux disease)     Past Surgical History  Procedure Date  . Hernia repair   . Tonsillectomy   . Adenoidectomy   . Wisdom tooth extraction     Family History  Problem Relation Age of Onset  . Diabetes Mother   . Heart failure Other     History  Substance Use Topics  . Smoking status: Former Games developer  . Smokeless tobacco: Not on file  . Alcohol Use: Yes      Review of Systems  Constitutional: Negative.   HENT: Positive for  sore throat and neck pain. Negative for ear pain, congestion, trouble swallowing, dental problem, postnasal drip and ear discharge.   Eyes: Negative.   Respiratory: Negative.   Cardiovascular: Negative.   Gastrointestinal: Negative.   Genitourinary: Negative.   Musculoskeletal: Positive for back pain.  Skin: Negative.   Neurological: Negative for numbness and headaches.  Hematological: Negative.     Allergies  Sulfonamide derivatives  Home Medications   Current Outpatient Rx  Name Route Sig Dispense Refill  . CETIRIZINE HCL 10 MG PO TABS Oral Take 10 mg by mouth daily.    . MELOXICAM 15 MG PO TABS Oral Take 1 tablet (15 mg total) by mouth daily. Take with food each morning 15 tablet 1  . OMEPRAZOLE 40 MG PO CPDR Oral Take 1 capsule (40 mg total) by mouth 2 (two) times daily. 60 capsule 0    BP 153/89  Pulse 96  Temp(Src) 98.5 F (36.9 C) (Oral)  Resp 18  Ht 5\' 10"  (1.778 m)  Wt 208 lb (94.348 kg)  BMI 29.84 kg/m2  SpO2 97%  Physical Exam Nursing notes and Vital Signs reviewed. Appearance:  Patient appears healthy, stated age, and in no acute distress Eyes:  Pupils are equal, round, and reactive to light and accomodation.  Extraocular movement is intact.  Conjunctivae are not inflamed  Ears:  Canals normal.  Tympanic membranes normal.  Nose:  Mildly congested turbinates.  No sinus tenderness.   Pharynx:  Normal Neck:  Supple.  No adenopathy.  No thyromegaly.  Mild tenderness over right sternocleidomastoid muscle and right trapezius muscle. Lungs:  Clear to auscultation.  Breath sounds are equal.  Heart:  Regular rate and rhythm without murmurs, rubs, or gallops.  Abdomen:  Nontender without masses or hepatosplenomegaly.  Bowel sounds are present.  No CVA or flank tenderness.  Extremities:  No edema.  No calf tenderness.  Right shoulder:  full range of motion.  There is distinct tenderness over the insertions of right biceps tendons.  Distal Neurovascular function is  intact.  Back:  Tednerness along upper medial edge of right scapula Skin:  No rash present.   ED Course  Procedures none  Labs Reviewed  POCT FASTING CBG [facility] MANUAL ENTRY - Abnormal; Notable for the following:    POCT Glucose ([facility]) 158 (*) 1 1/2 hr PP   All other components within normal limits  POCT CBC W AUTO DIFF ([facility]);  WBC 9.0; LY 19.9; MO 5.4; GR 74.7; Hgb 15.1; Platelets 156    Dg Chest 2 View  07/31/2011  *RADIOLOGY REPORT*  Clinical Data: Right neck and shoulder pain for several months  CHEST - 2 VIEW  Comparison: None.  Findings:  The heart size and mediastinal contours are within normal limits.  Both lungs are clear.  The visualized skeletal structures are unremarkable.  IMPRESSION: No active cardiopulmonary disease.  Original Report Authenticated By: Elsie Stain, M.D.   Dg Cervical Spine 2-3 Views  07/31/2011  *RADIOLOGY REPORT*  Clinical Data: Neck pain.  Right shoulder pain.  CERVICAL SPINE - 4+ VIEWS  Comparison:  None.  Findings:  There is no evidence of cervical spine fracture or prevertebral soft tissue swelling.  Alignment is normal.  No other significant bone abnormalities are identified.  IMPRESSION: Negative cervical spine radiographs.  Original Report Authenticated By: Elsie Stain, M.D.     1. Neck pain on right side; note that pain is approximately in area of C3-C4 dermatomes.  Rule out cervical radiculopathy   2. Biceps tendonitis, right   3. Rhomboid muscle pain       MDM  Begin Mobic.  Schedule CT scan neck. Apply ice pack to painful areas two or three times daily.  Begin range of motion and stretching exercises as per instructions sheets. Followup with Family Doctor as scheduled (Recommend glucose tolerance test):  Patient has had increased thirst, and family history of type I diabetes (father)         Lattie Haw, MD 07/31/11 [phone]

## 2011-08-02 ENCOUNTER — Telehealth: Payer: Self-pay | Admitting: *Deleted

## 2011-08-02 ENCOUNTER — Telehealth: Payer: Self-pay | Admitting: Emergency Medicine

## 2011-08-05 ENCOUNTER — Other Ambulatory Visit: Payer: BC Managed Care – PPO

## 2011-08-06 ENCOUNTER — Telehealth: Payer: Self-pay | Admitting: *Deleted

## 2011-08-06 NOTE — ED Notes (Signed)
Pt called concerned that he was scheduled for a CT of the C-spine and he feels as though his problem is related to his throat, not his c-spine. Per Dr Cathren Harsh he is concerned that the pt may have a disc problem given the area of his pain as well as the fact that he lifts weights frequently. Explained this to the pt, he became upset and stated that he wanted to cancel the appt with [facility]. I advised the pt if he is concerned about his throat pain he should f/u with an ENT telephone numbers given or f/u with his PCP. Pt agrees. I called [facility] and cancelled his appt for 08/07/11 per his request.

## 2011-08-07 ENCOUNTER — Ambulatory Visit (INDEPENDENT_AMBULATORY_CARE_PROVIDER_SITE_OTHER): Payer: BC Managed Care – PPO | Admitting: Physician Assistant

## 2011-08-07 ENCOUNTER — Other Ambulatory Visit: Payer: BC Managed Care – PPO

## 2011-08-07 ENCOUNTER — Encounter: Payer: Self-pay | Admitting: Physician Assistant

## 2011-08-07 VITALS — BP 131/82 | HR 69 | Ht 70.0 in | Wt 212.0 lb

## 2011-08-07 DIAGNOSIS — M25519 Pain in unspecified shoulder: Secondary | ICD-10-CM

## 2011-08-07 DIAGNOSIS — K219 Gastro-esophageal reflux disease without esophagitis: Secondary | ICD-10-CM

## 2011-08-07 DIAGNOSIS — M25511 Pain in right shoulder: Secondary | ICD-10-CM

## 2011-08-07 DIAGNOSIS — M542 Cervicalgia: Secondary | ICD-10-CM

## 2011-08-07 DIAGNOSIS — J309 Allergic rhinitis, unspecified: Secondary | ICD-10-CM

## 2011-08-07 DIAGNOSIS — R0609 Other forms of dyspnea: Secondary | ICD-10-CM

## 2011-08-07 DIAGNOSIS — J302 Other seasonal allergic rhinitis: Secondary | ICD-10-CM

## 2011-08-07 DIAGNOSIS — R0683 Snoring: Secondary | ICD-10-CM

## 2011-08-07 LAB — CBC WITH DIFFERENTIAL/PLATELET
Basophils Relative: 0 % (ref 0–1)
Eosinophils Absolute: 0.1 10*3/uL (ref 0.0–0.7)
Hemoglobin: 15.7 g/dL (ref 13.0–17.0)
Lymphs Abs: 1.5 10*3/uL (ref 0.7–4.0)
MCH: 31.9 pg (ref 26.0–34.0)
Neutro Abs: 4.3 10*3/uL (ref 1.7–7.7)
Neutrophils Relative %: 65 % (ref 43–77)
Platelets: 185 10*3/uL (ref 150–400)
RBC: 4.92 MIL/uL (ref 4.22–5.81)
WBC: 6.7 10*3/uL (ref 4.0–10.5)

## 2011-08-07 MED ORDER — METHYLPREDNISOLONE 4 MG PO KIT: PACK | ORAL | Status: AC

## 2011-08-07 MED ORDER — OMEPRAZOLE 40 MG PO CPDR: 40.0000 mg | DELAYED_RELEASE_CAPSULE | Freq: Two times a day (BID) | ORAL | Status: DC

## 2011-08-07 NOTE — Patient Instructions (Signed)
Nasonex 2 sprays daily each nostril. Start Medrol dose pak and increase omeprazole to twice a day. Follow up with Dr. Linford Arnold at regularly scheduled appt at that time if you have seen no improvement then we will consider EGD or MRI of neck. Will schedule sleep studies for snoring if not heard from [place] in 1 week with appt give [place] a call.

## 2011-08-07 NOTE — Progress Notes (Signed)
  Subjective:    Patient ID: Derek Berry, male    DOB: Jul 23, 1980, 31 y.o.   MRN: [id number]  HPI Presents with 5 months of right sided neck pain and some voice hoarsness. Patient remembers this starting around the superbowl when he was having numerous parties and screaming a lot. He voice felt weak and scratchy but then on night he started noticing pain on the left side of his throat. The pain is constant but not unbearable it "feels like a lump in his throat" although he cannot feel a lump in his neck. He does have more discomfort when he swallows and feels better after he drinks water. He voice is still "scratchy" but he has not lost it. He has smoked in the past but quit a year a go. Does have allergies and acid reflux and takes Zyrtec and omeprazole daily. History of snoring and wife states it has gotten worse in last 5 months.   He recently developed right shoulder and neck pain and stiffness. He is taking mobic and icing it does help some. He has had no injury.  He did go to urgent care last week and gave mobic for neck pain and ordered CT scan but did not address neck pain. Has not had CT scan yet.        Review of Systems     Objective:   Physical Exam  Constitutional: He is oriented to person, place, and time. He appears well-developed and well-nourished.  HENT:  Head: Normocephalic and atraumatic.  Right Ear: External ear normal.  Left Ear: External ear normal.  Mouth/Throat: Oropharynx is clear and moist. No oropharyngeal exudate.       TM's normal. Turbinates red and swollen bilaterally.  Eyes: Conjunctivae are normal.  Neck: Normal range of motion. Neck supple. No thyromegaly present.  Cardiovascular: Normal rate, regular rhythm and normal heart sounds.   Pulmonary/Chest: Effort normal and breath sounds normal.  Lymphadenopathy:    He has no cervical adenopathy.  Neurological: He is alert and oriented to person, place, and time.  Skin: Skin is warm and dry.    Psychiatric: He has a normal mood and affect. His behavior is normal.          Assessment & Plan:  Neck pain, right side/GERD/Allergies/Shoulder pain,right/Cervical neck pain/Snoring- Has long history of acid reflux could be making him feel like he has a lump in his throat. Will increase to 40mg  BID. Also has allergies will add nasal spray to make sure post nasal drip might not be causing symptoms. Gave sample of nasonex to use 2 sprays each nostril once a day. Also gave steroid to help decrease any inflammation his shoulders and neck along with if inflammation of vocal cords is causing throat pain. Due to snoring a lot more I question if this could be a part of the pain in throat. Will schedule a sleep study to evaluate for sleep apnea. I did talk about possible need for EGD due to hx of smoking and GERD to evaluate for Barrettes esophagus and also mention potential need to MRI of neck to evaluate soft tissue. Did get CBC to look at WBC count. Will call with results.

## 2011-08-08 NOTE — Progress Notes (Signed)
Quick Note:  Call patient with results. Let them know all labs are within normal limits. ______ 

## 2011-08-15 ENCOUNTER — Encounter: Payer: Self-pay | Admitting: Family Medicine

## 2011-08-15 ENCOUNTER — Ambulatory Visit (INDEPENDENT_AMBULATORY_CARE_PROVIDER_SITE_OTHER): Payer: BC Managed Care – PPO | Admitting: Family Medicine

## 2011-08-15 VITALS — BP 137/77 | HR 78 | Ht 70.0 in | Wt 213.0 lb

## 2011-08-15 DIAGNOSIS — M25511 Pain in right shoulder: Secondary | ICD-10-CM

## 2011-08-15 DIAGNOSIS — M25519 Pain in unspecified shoulder: Secondary | ICD-10-CM

## 2011-08-15 DIAGNOSIS — M542 Cervicalgia: Secondary | ICD-10-CM

## 2011-08-15 DIAGNOSIS — J029 Acute pharyngitis, unspecified: Secondary | ICD-10-CM

## 2011-08-15 MED ORDER — OMEPRAZOLE 40 MG PO CPDR: 40.0000 mg | DELAYED_RELEASE_CAPSULE | Freq: Two times a day (BID) | ORAL | Status: DC

## 2011-08-15 NOTE — Progress Notes (Signed)
  Subjective:    Patient ID: Derek Berry, male    DOB: 1981-01-11, 31 y.o.   MRN: [id number]  HPI Right shoulder pain - Stil there but better.  Can hurt into the trap and sometimes on the fonr to shoulder.  Says numbness or tingling into the arm.  No weakness or loss of ROM. He is able to lift weights at the gym without any difficulty or problem. Has not noticed any weakness in that arm.  ST - has been there for 6 months.Always on the right side of the neck. Started after screaming at a football game.  Says will get dryness in his throat and sometimes a throbbing in his throat but comes and goes.  2 weeks ago they inc her PPI bot BID and added Nasonex.  No dysphagia. Had wisdom tooth taken out about 2 years ago.  No ear pain but sometime runs behind the ear.  Sais does grind his teeth at night.  Hx of tonsillectomy.  Doesn't sleep well. + snore.   Sleep study schedule for next week for snoring.   Review of Systems     Objective:   Physical Exam  Constitutional: He is oriented to person, place, and time. He appears well-developed and well-nourished.  HENT:  Head: Normocephalic and atraumatic.  Right Ear: External ear normal.  Left Ear: External ear normal.  Nose: Nose normal.  Mouth/Throat: Oropharynx is clear and moist.       TMs and canals are clear.   Eyes: Conjunctivae and EOM are normal. Pupils are equal, round, and reactive to light.  Neck: Neck supple. No thyromegaly present.  Cardiovascular: Normal rate and normal heart sounds.   Pulmonary/Chest: Effort normal and breath sounds normal.  Musculoskeletal:       Right shoulder with NROM and strength is 5/5.  nontender on exam. Neck with NROM.   Lymphadenopathy:    He has no cervical adenopathy.  Neurological: He is alert and oriented to person, place, and time. He has normal reflexes.  Skin: Skin is warm and dry.  Psychiatric: He has a normal mood and affect.          Assessment & Plan:  ST - Recommend referral to ENT  for further evaluation.  I really think he needs to be exam and have his focal cords without to make sure that everything is normal. Especially since this is been going on for about 6 months at this point in time. Continue with taking the omeprazole twice a day and the Nasonex. He has been on that regimen for about a week. I would like him to be on for at least 2 to see if he feels it is helping him or not.  certainly the sore throat could also be coming from the snoring. He has a sleep study scheduled in one week. He could also be related to his reflux or postnasal drip from allergies.  Right shoulder Pain - discussed options. Likely burisitis.  No evidence of rotator cuff tear. We discussed he can take anti-inflammatory. Avoid sleeping on that shoulder. Consider steroid injection if not improving.  Can schedule CPE anytime.

## 2011-08-15 NOTE — Patient Instructions (Addendum)
We will call you with the referral for ENT.

## 2011-08-22 ENCOUNTER — Encounter: Payer: Self-pay | Admitting: *Deleted

## 2011-08-28 ENCOUNTER — Ambulatory Visit (INDEPENDENT_AMBULATORY_CARE_PROVIDER_SITE_OTHER): Payer: BC Managed Care – PPO | Admitting: Physician Assistant

## 2011-08-28 ENCOUNTER — Encounter: Payer: Self-pay | Admitting: Physician Assistant

## 2011-08-28 VITALS — BP 118/76 | HR 73 | Ht 70.0 in | Wt 209.0 lb

## 2011-08-28 DIAGNOSIS — G4700 Insomnia, unspecified: Secondary | ICD-10-CM

## 2011-08-28 DIAGNOSIS — K219 Gastro-esophageal reflux disease without esophagitis: Secondary | ICD-10-CM

## 2011-08-28 DIAGNOSIS — R5383 Other fatigue: Secondary | ICD-10-CM

## 2011-08-28 DIAGNOSIS — Z131 Encounter for screening for diabetes mellitus: Secondary | ICD-10-CM

## 2011-08-28 DIAGNOSIS — Z13220 Encounter for screening for lipoid disorders: Secondary | ICD-10-CM

## 2011-08-28 DIAGNOSIS — Z0000 Encounter for general adult medical examination without abnormal findings: Secondary | ICD-10-CM

## 2011-08-28 DIAGNOSIS — G4730 Sleep apnea, unspecified: Secondary | ICD-10-CM

## 2011-08-28 MED ORDER — TRAZODONE HCL 50 MG PO TABS: 50.0000 mg | ORAL_TABLET | Freq: Every day | ORAL | Status: DC

## 2011-08-28 NOTE — Patient Instructions (Addendum)
Try melatonin if you want to 30 minutes to and hour before bedtime. If doesn't work or want to try trazadone then start 1/2 tab 30-1 hr before bed. Create good nighttime routine. Follow up in 4-6 weeks.   Get labs drawn after 8 hours of fasting. Will call with results.

## 2011-08-31 DIAGNOSIS — G4730 Sleep apnea, unspecified: Secondary | ICD-10-CM | POA: Insufficient documentation

## 2011-08-31 NOTE — Progress Notes (Signed)
  Subjective:    Patient ID: Derek Berry, male    DOB: April 15, 1980, 31 y.o.   MRN: [id number]  HPI Pt presents to the clinic for CPE. Patient recently had a sleep study test and showed that he did have mild apnea but did not need CPAP. His snoring continues to be so bad it wakes him up and he has problems going to sleep. He has not tried anything to make better. He is always tired and he doesn't know if it is b/c he is not sleeping or something else. He denies any depression or feelings of helplessness or hopelessness.   Has been seen in last month with hoarsness and feeling like lump in throat. It is much better today but does have appt with ENT in July. Continues to take prilosec twice a day and GERD is controlled. Flonase did not help the lump feeling or hoarsenss.   Review of Systems     Objective:   Physical Exam  Constitutional: He is oriented to person, place, and time. He appears well-developed and well-nourished.  HENT:  Head: Normocephalic and atraumatic.  Right Ear: External ear normal.  Left Ear: External ear normal.  Nose: Nose normal.  Mouth/Throat: Oropharynx is clear and moist. No oropharyngeal exudate.       TM's normal.  Eyes: Conjunctivae and EOM are normal. Pupils are equal, round, and reactive to light.  Neck: Normal range of motion. Neck supple. No thyromegaly present.  Cardiovascular: Normal rate, regular rhythm, normal heart sounds and intact distal pulses.   No murmur heard. Pulmonary/Chest: Effort normal and breath sounds normal. He has no wheezes.  Abdominal: Soft. Bowel sounds are normal. He exhibits no distension. There is no tenderness.  Genitourinary:       NOt done.  Musculoskeletal: Normal range of motion.  Lymphadenopathy:    He has no cervical adenopathy.  Neurological: He is alert and oriented to person, place, and time.  Skin: Skin is warm and dry.  Psychiatric: He has a normal mood and affect. His behavior is normal.          Assessment  & Plan:  CPE/Fatigue- Will check cholesterol, B12, Vit D, testosterone, TSH. Discussed regular exercise and balanced diet low in saturated fats and fried foods. Vaccines up to date. 4 servings of calcium is reccommended daily.  GERD- Controlled with prilosec twice a day.  Hoarseness/lump in throat- ENT appt in July. Symptoms are much better.   Insomnia- melatonin if you want to 30 minutes to and hour before bedtime. If doesn't work or want to try trazadone then start 1/2 tab 30-1 hr before bed. Create good nighttime routine. Follow up in 4-6 weeks.  Mild sleep apnea- Discussed sleeping on side instead of on back. Will find out more information on oral devices that might help with snoring and get back with patient.

## 2011-09-27 ENCOUNTER — Ambulatory Visit: Payer: BC Managed Care – PPO | Admitting: Physician Assistant

## 2012-04-01 ENCOUNTER — Other Ambulatory Visit: Payer: Self-pay | Admitting: *Deleted

## 2012-04-01 MED ORDER — OMEPRAZOLE 40 MG PO CPDR: 40.0000 mg | DELAYED_RELEASE_CAPSULE | Freq: Two times a day (BID) | ORAL | Status: DC

## 2012-06-03 ENCOUNTER — Other Ambulatory Visit: Payer: Self-pay | Admitting: *Deleted

## 2012-06-03 MED ORDER — OMEPRAZOLE 40 MG PO CPDR: 40.0000 mg | DELAYED_RELEASE_CAPSULE | Freq: Two times a day (BID) | ORAL | Status: DC

## 2013-01-26 ENCOUNTER — Encounter: Payer: Self-pay | Admitting: Family Medicine

## 2013-01-26 ENCOUNTER — Ambulatory Visit (INDEPENDENT_AMBULATORY_CARE_PROVIDER_SITE_OTHER): Payer: BC Managed Care – PPO | Admitting: Family Medicine

## 2013-01-26 VITALS — BP 122/67 | HR 75 | Temp 97.3°F | Ht 70.0 in | Wt 220.0 lb

## 2013-01-26 DIAGNOSIS — Z23 Encounter for immunization: Secondary | ICD-10-CM

## 2013-01-26 DIAGNOSIS — Z0000 Encounter for general adult medical examination without abnormal findings: Secondary | ICD-10-CM

## 2013-01-26 DIAGNOSIS — R41840 Attention and concentration deficit: Secondary | ICD-10-CM

## 2013-01-26 DIAGNOSIS — E669 Obesity, unspecified: Secondary | ICD-10-CM | POA: Insufficient documentation

## 2013-01-26 NOTE — Progress Notes (Signed)
Subjective:    Patient ID: Derek Berry, male    DOB: Aug 17, 1980, 32 y.o.   MRN: [id number]  HPI CPE - he is having a baby soon and wants to make sure has pertussis shot. Has had flu shot.  Smokes less than daily. Usually when he travels.    Was on Adderral in college and was diagnosed with ADHD. He was never formally tested. He discusses issues with his physician at that time and they did a trial of medication which he does feel was helpful. He is interested in being evaluated and treated again.  Job has been more stressful and feels having more difficulty concentrating and completing tasks.  Review of Systems Comprehensive ROS is neg.    BP 122/67  Pulse 75  Temp(Src) 97.3 F (36.3 C)  Ht 5\' 10"  (1.778 m)  Wt 220 lb (99.791 kg)  BMI 31.57 kg/m2    Allergies  Allergen Reactions  . Sulfonamide Derivatives     Past Medical History  Diagnosis Date  . Seasonal allergies   . GERD (gastroesophageal reflux disease)   . Hypertension   . Diabetes mellitus   . Alcoholism     Past Surgical History  Procedure Laterality Date  . Hernia repair    . Tonsillectomy    . Adenoidectomy    . Wisdom tooth extraction      History   Social History  . Marital Status: Single    Spouse Name: N/A    Number of Children: N/A  . Years of Education: N/A   Occupational History  . Not on file.   Social History Main Topics  . Smoking status: Current Every Day Smoker -- 0.25 packs/day    Types: Cigarettes  . Smokeless tobacco: Not on file  . Alcohol Use: Yes  . Drug Use: No  . Sexual Activity:    Other Topics Concern  . Not on file   Social History Narrative  . No narrative on file    Family History  Problem Relation Age of Onset  . Diabetes Mother   . Heart failure Other   . Diabetes Father   . Hypertension Father   . Alcohol abuse Father     Outpatient Encounter Prescriptions as of 01/26/2013  Medication Sig  . omeprazole (PRILOSEC) 40 MG capsule Take 1 capsule (40 mg  total) by mouth 2 (two) times daily.  . [DISCONTINUED] cetirizine (ZYRTEC) 10 MG tablet Take 10 mg by mouth daily.  . [DISCONTINUED] fluticasone (FLONASE) 50 MCG/ACT nasal spray Place 2 sprays into the nose daily.  . [DISCONTINUED] traZODone (DESYREL) 50 MG tablet Take 1 tablet (50 mg total) by mouth at bedtime.          Objective:   Physical Exam  Constitutional: He is oriented to person, place, and time. He appears well-developed and well-nourished.  HENT:  Head: Normocephalic and atraumatic.  Right Ear: External ear normal.  Left Ear: External ear normal.  Nose: Nose normal.  Mouth/Throat: Oropharynx is clear and moist.  Eyes: Conjunctivae and EOM are normal. Pupils are equal, round, and reactive to light.  Neck: Normal range of motion. Neck supple. No thyromegaly present.  Cardiovascular: Normal rate, regular rhythm, normal heart sounds and intact distal pulses.   No carotid bruits.   Pulmonary/Chest: Effort normal and breath sounds normal.  Abdominal: Soft. Bowel sounds are normal. He exhibits no distension and no mass. There is no tenderness. There is no rebound and no guarding.  Musculoskeletal: Normal range of motion.  Lymphadenopathy:    He has no cervical adenopathy.  Neurological: He is alert and oriented to person, place, and time. He has normal reflexes.  Skin: Skin is warm and dry.  Psychiatric: He has a normal mood and affect. His behavior is normal. Judgment and thought content normal.          Assessment & Plan:  Keep up a regular exercise program and make sure you are eating a healthy diet Try to eat 4 servings of dairy a day, or if you are lactose intolerant take a calcium with vitamin D daily.  Your vaccines are up to date.   Obesity BMI 31 - Discussed My Fitness Pal. He also is not exercising regularly but he does have a more physically active job. Encouraged him to start a regular exercise routine and to really counting his calories which she has not  done so far. If after one to 2 months on this regimen he has not lost weight in return for further discussion about weight loss.  Inattention-will refer for more formal evaluation for ADHD-we'll give him screening questionnaire today. ADHD - RS-IV score of 43, positive screen. Will refer to Dr. Dellia Cloud for further evaluation   Tdap updated today.

## 2013-01-26 NOTE — Patient Instructions (Addendum)
Try using My [employer]itness Pal, if you're still unsuccessful at losing weight over the next 6-8 weeks and please come back so that we can discuss further options. We will place a referral for further evaluation for ADHD testing. Please be on the lookout from a phone call from our office. They will likely call you next week. Try to get her lab work when you're able. The lab opens at 8 AM Monday through Friday. Please aspirate to 9 hours. You can drink water and take any medications.

## 2013-02-15 ENCOUNTER — Telehealth: Payer: Self-pay | Admitting: Family Medicine

## 2013-02-15 ENCOUNTER — Ambulatory Visit (INDEPENDENT_AMBULATORY_CARE_PROVIDER_SITE_OTHER): Payer: BC Managed Care – PPO | Admitting: Licensed Clinical Social Worker

## 2013-02-15 DIAGNOSIS — F4320 Adjustment disorder, unspecified: Secondary | ICD-10-CM

## 2013-02-15 MED ORDER — FLUCONAZOLE 150 MG PO TABS: 150.0000 mg | ORAL_TABLET | Freq: Every day | ORAL | Status: DC

## 2013-02-15 NOTE — Telephone Encounter (Signed)
Pt came by. Wife has recurring yeast infection and her dr. Recommended that patient be prescibed pill to prevent her from getting further yeast infections?

## 2013-02-15 NOTE — Telephone Encounter (Signed)
Ok will call in 3 days of diflucan.

## 2013-02-15 NOTE — Telephone Encounter (Signed)
Pt informed.Derek Berry [place]  

## 2013-02-16 ENCOUNTER — Ambulatory Visit (INDEPENDENT_AMBULATORY_CARE_PROVIDER_SITE_OTHER): Payer: BC Managed Care – PPO | Admitting: Licensed Clinical Social Worker

## 2013-02-16 ENCOUNTER — Encounter: Payer: Self-pay | Admitting: Family Medicine

## 2013-02-16 ENCOUNTER — Telehealth: Payer: Self-pay | Admitting: Family Medicine

## 2013-02-16 DIAGNOSIS — F988 Other specified behavioral and emotional disorders with onset usually occurring in childhood and adolescence: Secondary | ICD-10-CM

## 2013-02-16 DIAGNOSIS — F4320 Adjustment disorder, unspecified: Secondary | ICD-10-CM

## 2013-02-16 NOTE — Telephone Encounter (Signed)
Pt informed he would like to p/u rx tomorrow morning.Loralee Pacas [place]

## 2013-02-16 NOTE — Telephone Encounter (Signed)
Please call patient: Head to get his report back for his evaluation for ADD. They do feel that he has the diagnosis.. If he would like to go ahead and restart Adderall we can. He will then need to follow up with me in one month on the medication so we can see how well he is doing on it, check his blood pressure and adjust his dose as needed. Please let him know that it is a scheduled drugs or he would need to pick up a paper copy prescription. I would recommend starting the extended release version of Adderall to get him through the bulk of his work day.

## 2013-02-17 MED ORDER — AMPHETAMINE-DEXTROAMPHET ER 15 MG PO CP24: 15.0000 mg | ORAL_CAPSULE | ORAL | Status: DC

## 2013-02-18 ENCOUNTER — Telehealth: Payer: Self-pay | Admitting: *Deleted

## 2013-02-18 NOTE — Telephone Encounter (Signed)
Adderall PA obtained. Pt informed.  Meyer Cory, LPN

## 2013-03-16 ENCOUNTER — Encounter: Payer: Self-pay | Admitting: Family Medicine

## 2013-03-16 ENCOUNTER — Ambulatory Visit (INDEPENDENT_AMBULATORY_CARE_PROVIDER_SITE_OTHER): Payer: BC Managed Care – PPO | Admitting: Family Medicine

## 2013-03-16 VITALS — BP 116/71 | HR 84 | Wt 213.0 lb

## 2013-03-16 DIAGNOSIS — F988 Other specified behavioral and emotional disorders with onset usually occurring in childhood and adolescence: Secondary | ICD-10-CM

## 2013-03-16 LAB — COMPLETE METABOLIC PANEL WITH GFR
ALT: 25 U/L (ref 0–53)
AST: 13 U/L (ref 0–37)
Albumin: 4.8 g/dL (ref 3.5–5.2)
Alkaline Phosphatase: 63 U/L (ref 39–117)
BUN: 11 mg/dL (ref 6–23)
CO2: 28 mEq/L (ref 19–32)
Calcium: 9.5 mg/dL (ref 8.4–10.5)
Chloride: 105 mEq/L (ref 96–112)
Creat: 1.01 mg/dL (ref 0.50–1.35)
GFR, Est African American: >89 mL/min
GFR, Est Non African American: >89 mL/min
Glucose, Bld: 94 mg/dL (ref 70–99)
Potassium: 4.3 mEq/L (ref 3.5–5.3)
Sodium: 140 mEq/L (ref 135–145)
Total Bilirubin: 0.7 mg/dL (ref 0.3–1.2)
Total Protein: 7 g/dL (ref 6.0–8.3)

## 2013-03-16 LAB — LIPID PANEL
Cholesterol: 177 mg/dL (ref 0–200)
HDL: 39 mg/dL — ABNORMAL LOW (ref >39)
LDL Cholesterol: 113 mg/dL — ABNORMAL HIGH (ref 0–99)
Total CHOL/HDL Ratio: 4.5 Ratio
Triglycerides: 124 mg/dL (ref <150)
VLDL: 25 mg/dL (ref 0–40)

## 2013-03-16 LAB — TSH: TSH: 1.212 u[IU]/mL (ref 0.350–4.500)

## 2013-03-16 MED ORDER — AMPHETAMINE-DEXTROAMPHET ER 20 MG PO CP24: 20.0000 mg | ORAL_CAPSULE | ORAL | Status: DC

## 2013-03-16 NOTE — Progress Notes (Signed)
   Subjective:    Patient ID: Derek Berry, male    DOB: 04/10/1980, 33 y.o.   MRN: [id number]  HPI ADD- we restarted his Adderall about a month ago. He did get formal testing and they felt that his symptoms were consistent with ADD. No chest pain or shortness of breath or palpitations the medication. It is not affecting his sleep. He is not skipping meals.  Mood has been better and has been more organized at work. Feeling it wearing off early in the afternoon.  When was on it before was on 20mg .     Review of Systems     Objective:   Physical Exam  Constitutional: He is oriented to person, place, and time. He appears well-developed and well-nourished.  HENT:  Head: Normocephalic and atraumatic.  Cardiovascular: Normal rate, regular rhythm and normal heart sounds.   Pulmonary/Chest: Effort normal and breath sounds normal.  Neurological: He is alert and oriented to person, place, and time.  Skin: Skin is warm and dry.  Psychiatric: He has a normal mood and affect. His behavior is normal.          Assessment & Plan:  ADD - well controlled but wearing off early.  No S.E. Will increase to 20mg  for now. F/U in 3-4 months.

## 2013-04-09 ENCOUNTER — Telehealth: Payer: Self-pay | Admitting: *Deleted

## 2013-04-09 ENCOUNTER — Encounter: Payer: Self-pay | Admitting: *Deleted

## 2013-04-09 NOTE — Telephone Encounter (Signed)
Pt called and stated that his employer is asking for a letter stating that Dr. Linford ArnoldMetheney prescribes adderal for him due to his urine drug screen came back positive for amphetamines will fax letter to [phone].Loralee PacasB[place], Brenin Heidelberger [place]

## 2013-04-09 NOTE — Telephone Encounter (Signed)
Faxed pt's med list.Derek Berry, [name]

## 2013-05-04 ENCOUNTER — Other Ambulatory Visit: Payer: Self-pay | Admitting: *Deleted

## 2013-05-04 MED ORDER — OMEPRAZOLE 40 MG PO CPDR: 40.0000 mg | DELAYED_RELEASE_CAPSULE | Freq: Two times a day (BID) | ORAL | Status: DC

## 2013-05-27 ENCOUNTER — Ambulatory Visit: Payer: BC Managed Care – PPO | Admitting: Family Medicine

## 2013-06-02 ENCOUNTER — Ambulatory Visit (INDEPENDENT_AMBULATORY_CARE_PROVIDER_SITE_OTHER): Payer: Managed Care, Other (non HMO) | Admitting: Family Medicine

## 2013-06-02 ENCOUNTER — Encounter: Payer: Self-pay | Admitting: Family Medicine

## 2013-06-02 VITALS — BP 131/84 | HR 91 | Wt 206.0 lb

## 2013-06-02 DIAGNOSIS — F988 Other specified behavioral and emotional disorders with onset usually occurring in childhood and adolescence: Secondary | ICD-10-CM

## 2013-06-02 MED ORDER — AMPHETAMINE-DEXTROAMPHETAMINE 20 MG PO TABS: 20.0000 mg | ORAL_TABLET | Freq: Every day | ORAL | Status: DC

## 2013-06-02 MED ORDER — AMPHETAMINE-DEXTROAMPHET ER 20 MG PO CP24: 20.0000 mg | ORAL_CAPSULE | ORAL | Status: DC

## 2013-06-02 NOTE — Progress Notes (Signed)
   Subjective:    Patient ID: Derek Berry, male    DOB: 11/28/1980, 33 y.o.   MRN: [id number]  HPI ADD- here to followup today. He is currently on Adderall XR 20 mg daily. We increased his dose 3 months ago. Chest pain, shortness of breath, palpitations. Not keeping him awake at night. Is not skipping any meals. His weight is stable. He has been trying to intentionally lose weight. He still feels wearing off early. Wears off around 2-3 PM.  Says has a new job as well.    Review of Systems     Objective:   Physical Exam  Constitutional: He is oriented to person, place, and time. He appears well-developed and well-nourished.  HENT:  Head: Normocephalic and atraumatic.  Cardiovascular: Normal rate, regular rhythm and normal heart sounds.   Pulmonary/Chest: Effort normal and breath sounds normal.  Neurological: He is alert and oriented to person, place, and time.  Skin: Skin is warm and dry.  Psychiatric: He has a normal mood and affect. His behavior is normal.          Assessment & Plan:  ADD- not maximally controlled. It is working well this is wearing off early. We'll add a short acting Adderall in the afternoon. Call if any problems or side effects with the medication. Otherwise I'll see him back in 3 months.  F/U in 3-4 months.

## 2013-06-14 ENCOUNTER — Ambulatory Visit: Payer: BC Managed Care – PPO | Admitting: Family Medicine

## 2013-09-06 ENCOUNTER — Encounter: Payer: Self-pay | Admitting: Family Medicine

## 2013-09-06 ENCOUNTER — Ambulatory Visit (INDEPENDENT_AMBULATORY_CARE_PROVIDER_SITE_OTHER): Payer: Managed Care, Other (non HMO) | Admitting: Family Medicine

## 2013-09-06 VITALS — BP 138/79 | HR 90 | Wt 202.0 lb

## 2013-09-06 DIAGNOSIS — F988 Other specified behavioral and emotional disorders with onset usually occurring in childhood and adolescence: Secondary | ICD-10-CM

## 2013-09-06 DIAGNOSIS — I8393 Asymptomatic varicose veins of bilateral lower extremities: Secondary | ICD-10-CM | POA: Insufficient documentation

## 2013-09-06 DIAGNOSIS — F17200 Nicotine dependence, unspecified, uncomplicated: Secondary | ICD-10-CM

## 2013-09-06 DIAGNOSIS — I8390 Asymptomatic varicose veins of unspecified lower extremity: Secondary | ICD-10-CM

## 2013-09-06 MED ORDER — AMPHETAMINE-DEXTROAMPHET ER 20 MG PO CP24: 20.0000 mg | ORAL_CAPSULE | ORAL | Status: DC

## 2013-09-06 MED ORDER — OMEPRAZOLE 40 MG PO CPDR: 40.0000 mg | DELAYED_RELEASE_CAPSULE | Freq: Every day | ORAL | Status: DC

## 2013-09-06 NOTE — Progress Notes (Signed)
   Subjective:    Patient ID: Derek Berry, male    DOB: 09/20/1980, 33 y.o.   MRN: [id number]  HPI ADD- we started short acting in the afternoon. He actually felt like the 20 mg this was a little too strong and has been splitting the tab in half. He says the 10 mg works great. Says doesn't take extra dose every day but has been splitting it.  No CP or SOB or palpitations.  Has lost 4 lbs but has been trying to lose weight. Not affecting sleep.     tob abuse - has some questions aobut e-cigs.  He quit smoking for a short time completely but has not picked up using a cigarettes. He's been using a 6 mcg dose of nicotine.  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 Several family members with this. He denies any pain or irritation with it.  Review of Systems     Objective:   Physical Exam  Constitutional: He is oriented to person, place, and time. He appears well-developed and well-nourished.  HENT:  Head: Normocephalic and atraumatic.  Cardiovascular: Normal rate, regular rhythm and normal heart sounds.   Pulmonary/Chest: Effort normal and breath sounds normal.  Neurological: He is alert and oriented to person, place, and time.  Skin: Skin is warm and dry.  He does have some spider veins and varicose veins around the medial ankles bilaterally. No swelling.  Psychiatric: He has a normal mood and affect. His behavior is normal.          Assessment & Plan:  ADD- he will call for the short acting and will decrease to 10mg  when due. Will refill Adderall XR today, for the next 3 months. He can followup in 3-4 months..   Tobacco abuse-we discussed the pros and cons of electronic cigarettes. There were actually designed to help people quit smoking. I would like him to completely cut out nicotine and we discussed this today.  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 For prevention I recommend compression stockings. I recommended a place to get these.

## 2013-09-13 ENCOUNTER — Encounter: Payer: Self-pay | Admitting: Emergency Medicine

## 2013-09-13 ENCOUNTER — Emergency Department
Admission: EM | Admit: 2013-09-13 | Discharge: 2013-09-13 | Disposition: A | Discharge status: Home / Self Care | Payer: Managed Care, Other (non HMO) | Source: Home / Self Care | Attending: Emergency Medicine | Admitting: Emergency Medicine

## 2013-09-13 DIAGNOSIS — S0003XA Contusion of scalp, initial encounter: Secondary | ICD-10-CM

## 2013-09-13 DIAGNOSIS — H1131 Conjunctival hemorrhage, right eye: Secondary | ICD-10-CM

## 2013-09-13 DIAGNOSIS — S1093XA Contusion of unspecified part of neck, initial encounter: Secondary | ICD-10-CM

## 2013-09-13 DIAGNOSIS — S0083XA Contusion of other part of head, initial encounter: Secondary | ICD-10-CM

## 2013-09-13 DIAGNOSIS — H1130 Conjunctival hemorrhage, unspecified eye: Secondary | ICD-10-CM

## 2013-09-13 MED ORDER — POLYMYXIN B-TRIMETHOPRIM 10000-0.1 UNIT/ML-% OP SOLN: 1.0000 [drp] | Freq: Four times a day (QID) | OPHTHALMIC | Status: DC

## 2013-09-13 MED ORDER — HYDROCODONE-ACETAMINOPHEN 5-325 MG PO TABS: 1.0000 | ORAL_TABLET | Freq: Four times a day (QID) | ORAL | Status: DC | PRN

## 2013-09-13 MED ORDER — CEPHALEXIN 500 MG PO CAPS: 500.0000 mg | ORAL_CAPSULE | Freq: Two times a day (BID) | ORAL | Status: DC

## 2013-09-13 NOTE — ED Notes (Signed)
Was hit in right eye with golf ball yesterday; now swollen and edematous.

## 2013-09-13 NOTE — ED Provider Notes (Signed)
CSN: [id number]     Arrival date & time 09/13/13  1634 History   First MD Initiated Contact with Patient 09/13/13 1722     Chief Complaint  Patient presents with  . Eye Pain   (Consider location/radiation/quality/duration/timing/severity/associated sxs/prior Treatment) HPI Tawanna Coolerodd presents today with an EYE COMPLAINT.  He was hit by a golf ball chipped out of a sand trap while playing yesterday.  He was hit about 2cm below the R eye and it has been swelling since.  Ice helps control the swelling.  This morning noticed that the white part of his eye was red too.  No HA, N/V, numbness, tingling, weakness, blurry vision, bloody nose.  Has been using Ibuprofen for pain relief which has helped.  Location: right  Onset: 1  Days   Symptoms: Redness: yes Discharge: no Pain: yes, in the R cheek & teeth, not eye Photophobia: no Decreased Vision: no URI symptoms: no Itching/Allergy sxs: no Glaucoma: no Recent eye surgery: no Contact lens use: no  Red Flags Trauma: yes, as described above. Foreign Body: no Vomiting/HA: no Halos around lights: no Chickenpox or zoster: no     Past Medical History  Diagnosis Date  . Seasonal allergies   . GERD (gastroesophageal reflux disease)   . Hypertension   . Diabetes mellitus   . Alcoholism    Past Surgical History  Procedure Laterality Date  . Hernia repair    . Tonsillectomy    . Adenoidectomy    . Wisdom tooth extraction     Family History  Problem Relation Age of Onset  . Diabetes Mother   . Heart failure Other   . Diabetes Father   . Hypertension Father   . Alcohol abuse Father    History  Substance Use Topics  . Smoking status: Light Tobacco Smoker -- 0.25 packs/day    Types: Cigarettes  . Smokeless tobacco: Not on file  . Alcohol Use: Yes    Review of Systems  All other systems reviewed and are negative.   Allergies  Sulfonamide derivatives  Home Medications   Prior to Admission medications   Medication Sig Start  Date End Date Taking? Authorizing Provider  amphetamine-dextroamphetamine (ADDERALL XR) 20 MG 24 hr capsule Take 1 capsule (20 mg total) by mouth every morning. Ok to fill 11/11/13 09/06/13   Agapito Gamesatherine D Metheney, MD  amphetamine-dextroamphetamine (ADDERALL) 20 MG tablet Take 10 mg by mouth daily. In the afternoon.  Ok to fill 08/02/2013 06/02/13   Agapito Gamesatherine D Metheney, MD  cephALEXin (KEFLEX) 500 MG capsule Take 1 capsule (500 mg total) by mouth 2 (two) times daily. 09/13/13   Marlaine HindJeffrey H Rayvion Stumph, MD  HYDROcodone-acetaminophen (NORCO/VICODIN) 5-325 MG per tablet Take 1 tablet by mouth every 6 (six) hours as needed for moderate pain (severe pain). 09/13/13   Marlaine HindJeffrey H Shiana Rappleye, MD  omeprazole (PRILOSEC) 40 MG capsule Take 1 capsule (40 mg total) by mouth daily. 09/06/13 09/06/14  Agapito Gamesatherine D Metheney, MD  trimethoprim-polymyxin b (POLYTRIM) ophthalmic solution Place 1 drop into the right eye every 6 (six) hours. 09/13/13   Marlaine HindJeffrey H Persis Graffius, MD   BP 126/81  Pulse 85  Temp(Src) 98.5 F (36.9 C) (Oral)  Resp 16  Ht 5\' 10"  (1.778 m)  Wt 195 lb (88.451 kg)  BMI 27.98 kg/m2  SpO2 97% Physical Exam  Nursing note and vitals reviewed. Constitutional: He is oriented to person, place, and time. He appears well-developed and well-nourished.  HENT:  Head: Normocephalic and atraumatic.    Grabbing  and pulling maxilla and pressing on zygomatic bone demonstrates no laxity.  No crepitus in zygomatic bone.  No nasal trauma or tenderness.  No nasal septal hematoma.  Eyes: EOM are normal. Pupils are equal, round, and reactive to light. Right eye exhibits no discharge and no exudate. No foreign body present in the right eye. Right conjunctiva is not injected. Right conjunctiva has a hemorrhage. Left conjunctiva is not injected. Left conjunctiva has no hemorrhage. No scleral icterus. Right eye exhibits normal extraocular motion and no nystagmus. Left eye exhibits normal extraocular motion and no nystagmus.     Subconjunctival hemorrhage as shown in drawing.  Neck: Neck supple.  Cardiovascular: Regular rhythm and normal heart sounds.   Pulmonary/Chest: Effort normal and breath sounds normal. No respiratory distress.  Neurological: He is alert and oriented to person, place, and time. He is not disoriented. No cranial nerve deficit. Gait normal. GCS eye subscore is 4. GCS verbal subscore is 5. GCS motor subscore is 6.  Skin: Skin is warm and dry.  Psychiatric: He has a normal mood and affect. His speech is normal.    ED Course  Procedures (including critical care time) Labs Review Labs Reviewed - No data to display  Imaging Review No results found.   MDM   1. Facial contusion, initial encounter   2. Subconjunctival hematoma, right     This patient has a right-sided facial contusion.  Pt offered imaging, but will defer for now.  If further symptoms such as blurry vision or neuro symptoms, needs a CT scan (likely not an Xray which wouldn't show what we need to see) and referral either to ophtho vs OMFS depending on complaints & findings.  Will treat with Norco for pain #12 (rather than NSAIDs which will thin blood some), keflex and polytrim to prevent infection, and cryotherapy for swelling.  Educated that subconjunctival hemorrhage and contusion will slowly resolve.  Nurse to call patient in 2 days to check up on patient.  Ensure that vision is still ok and symptoms are resolving.   Marlaine Hind, MD 09/13/13 1747

## 2013-12-06 ENCOUNTER — Ambulatory Visit: Payer: Managed Care, Other (non HMO) | Admitting: Family Medicine

## 2014-02-14 ENCOUNTER — Encounter: Payer: Self-pay | Admitting: Family Medicine

## 2014-02-14 ENCOUNTER — Ambulatory Visit (INDEPENDENT_AMBULATORY_CARE_PROVIDER_SITE_OTHER): Payer: Managed Care, Other (non HMO) | Admitting: Family Medicine

## 2014-02-14 VITALS — BP 101/62 | HR 91 | Ht 70.0 in | Wt 209.0 lb

## 2014-02-14 DIAGNOSIS — R51 Headache: Secondary | ICD-10-CM

## 2014-02-14 DIAGNOSIS — Z0000 Encounter for general adult medical examination without abnormal findings: Secondary | ICD-10-CM

## 2014-02-14 DIAGNOSIS — F988 Other specified behavioral and emotional disorders with onset usually occurring in childhood and adolescence: Secondary | ICD-10-CM

## 2014-02-14 DIAGNOSIS — R519 Headache, unspecified: Secondary | ICD-10-CM

## 2014-02-14 DIAGNOSIS — F909 Attention-deficit hyperactivity disorder, unspecified type: Secondary | ICD-10-CM

## 2014-02-14 LAB — COMPLETE METABOLIC PANEL WITH GFR
ALK PHOS: 65 U/L (ref 39–117)
ALT: 32 U/L (ref 0–53)
AST: 17 U/L (ref 0–37)
Albumin: 4.8 g/dL (ref 3.5–5.2)
BILIRUBIN TOTAL: 0.8 mg/dL (ref 0.2–1.2)
BUN: 15 mg/dL (ref 6–23)
CO2: 27 mEq/L (ref 19–32)
Calcium: 9.5 mg/dL (ref 8.4–10.5)
Chloride: 103 mEq/L (ref 96–112)
Creat: 1.05 mg/dL (ref 0.50–1.35)
GFR, Est African American: >89 mL/min
Glucose, Bld: 86 mg/dL (ref 70–99)
Potassium: 4.5 mEq/L (ref 3.5–5.3)
Sodium: 139 mEq/L (ref 135–145)
Total Protein: 7.1 g/dL (ref 6.0–8.3)

## 2014-02-14 LAB — LIPID PANEL
CHOL/HDL RATIO: 4 ratio
CHOLESTEROL: 198 mg/dL (ref 0–200)
HDL: 50 mg/dL (ref >39)
LDL Cholesterol: 129 mg/dL — ABNORMAL HIGH (ref 0–99)
Triglycerides: 97 mg/dL (ref <150)
VLDL: 19 mg/dL (ref 0–40)

## 2014-02-14 LAB — TSH: TSH: 0.974 u[IU]/mL (ref 0.350–4.500)

## 2014-02-14 MED ORDER — AMPHETAMINE-DEXTROAMPHETAMINE 10 MG PO TABS: 10.0000 mg | ORAL_TABLET | Freq: Every day | ORAL | Status: DC

## 2014-02-14 NOTE — Patient Instructions (Signed)
Keep up a regular exercise program and make sure you are eating a healthy diet Try to eat 4 servings of dairy a day, or if you are lactose intolerant take a calcium with vitamin D daily.  Your vaccines are up to date.   

## 2014-02-14 NOTE — Progress Notes (Signed)
Subjective:    Patient ID: Derek Berry, male    DOB: 01/08/1981, 33 y.o.   MRN: [id number]  HPI Here for CPE today.    F/U ADD - Says he feels hte 20mg  XR is too strong. Says feels like his is spaced out.  He stopped the XR all together.  Stopped all of it for a week and didn't feel so spaced out at the end of the day.   Last July he was hit in the face, right cheek, with a golf ball. Says still occ tender.  Feels like it is stiff later in the day.  Noticing some eye twitching in the lat week or two.  No xrays at the time.    Review of Systems Comprehensive review of systems is negative.  BP 101/62 mmHg  Pulse 91  Ht 5\' 10"  (1.778 m)  Wt 209 lb (94.802 kg)  BMI 29.99 kg/m2  SpO2 99%    Allergies  Allergen Reactions  . Sulfonamide Derivatives     Past Medical History  Diagnosis Date  . Seasonal allergies   . GERD (gastroesophageal reflux disease)   . Hypertension   . Diabetes mellitus   . Alcoholism     Past Surgical History  Procedure Laterality Date  . Hernia repair    . Tonsillectomy    . Adenoidectomy    . Wisdom tooth extraction      History   Social History  . Marital Status: Single    Spouse Name: N/A    Number of Children: N/A  . Years of Education: N/A   Occupational History  . Not on file.   Social History Main Topics  . Smoking status: Light Tobacco Smoker -- 0.25 packs/day    Types: Cigarettes  . Smokeless tobacco: Not on file  . Alcohol Use: Yes  . Drug Use: No  . Sexual Activity: Not on file   Other Topics Concern  . Not on file   Social History Narrative    Family History  Problem Relation Age of Onset  . Diabetes Mother   . Heart failure Other   . Diabetes Father   . Hypertension Father   . Alcohol abuse Father     Outpatient Encounter Prescriptions as of 02/14/2014  Medication Sig  . omeprazole (PRILOSEC) 40 MG capsule Take 1 capsule (40 mg total) by mouth daily.  . [DISCONTINUED] amphetamine-dextroamphetamine  (ADDERALL XR) 20 MG 24 hr capsule Take 1 capsule (20 mg total) by mouth every morning. Ok to fill 11/11/13  . amphetamine-dextroamphetamine (ADDERALL) 10 MG tablet Take 1 tablet (10 mg total) by mouth daily with breakfast. Ok to fill on 04/15/13  . [DISCONTINUED] amphetamine-dextroamphetamine (ADDERALL) 10 MG tablet Take 1 tablet (10 mg total) by mouth daily with breakfast.  . [DISCONTINUED] amphetamine-dextroamphetamine (ADDERALL) 10 MG tablet Take 1 tablet (10 mg total) by mouth daily with breakfast. Ok to fill on 03/16/13  . [DISCONTINUED] amphetamine-dextroamphetamine (ADDERALL) 20 MG tablet Take 10 mg by mouth daily. In the afternoon.  Ok to fill 08/02/2013  . [DISCONTINUED] cephALEXin (KEFLEX) 500 MG capsule Take 1 capsule (500 mg total) by mouth 2 (two) times daily.  . [DISCONTINUED] HYDROcodone-acetaminophen (NORCO/VICODIN) 5-325 MG per tablet Take 1 tablet by mouth every 6 (six) hours as needed for moderate pain (severe pain).  . [DISCONTINUED] trimethoprim-polymyxin b (POLYTRIM) ophthalmic solution Place 1 drop into the right eye every 6 (six) hours.          Objective:   Physical Exam  Constitutional: He is oriented to person, place, and time. He appears well-developed and well-nourished.  HENT:  Head: Normocephalic and atraumatic.  Right Ear: External ear normal.  Left Ear: External ear normal.  Nose: Nose normal.  Mouth/Throat: Oropharynx is clear and moist.  Eyes: Conjunctivae and EOM are normal. Pupils are equal, round, and reactive to light.  Neck: Normal range of motion. Neck supple. No thyromegaly present.  Cardiovascular: Normal rate, regular rhythm, normal heart sounds and intact distal pulses.   No carotid bruit   Pulmonary/Chest: Effort normal and breath sounds normal.  Abdominal: Soft. Bowel sounds are normal. He exhibits no distension and no mass. There is no tenderness. There is no rebound and no guarding.  Musculoskeletal: Normal range of motion.  Lymphadenopathy:     He has no cervical adenopathy.  Neurological: He is alert and oriented to person, place, and time. He has normal reflexes.  Skin: Skin is warm and dry.  Psychiatric: He has a normal mood and affect. His behavior is normal. Judgment and thought content normal.          Assessment & Plan:  CPE  Keep up a regular exercise program and make sure you are eating a healthy diet Try to eat 4 servings of dairy a day, or if you are lactose intolerant take a calcium with vitamin D daily.  Your vaccines are up to date.   ADD - will dec dose to Adderral 10mg  daily. 90 days worth rx given  Right facial pain-will get CT scan maxillofacial for further evaluation. He's not having any numbness or tingling to suspect maxillary nerve entrapment. But he is having some stiffness in the muscles and now he's getting some fasciculations. If everything is normal then consider referral to ENT for further evaluation.

## 2014-02-16 ENCOUNTER — Telehealth: Payer: Self-pay | Admitting: *Deleted

## 2014-02-16 NOTE — Telephone Encounter (Signed)
CT maxillofacial approval [id number]. Radiology approval. Corliss SkainsJamie Alizandra Loh, CMA

## 2014-04-19 ENCOUNTER — Other Ambulatory Visit: Payer: Self-pay

## 2014-04-19 MED ORDER — AMPHETAMINE-DEXTROAMPHETAMINE 10 MG PO TABS: 10.0000 mg | ORAL_TABLET | Freq: Every day | ORAL | Status: DC

## 2014-04-19 NOTE — Telephone Encounter (Signed)
Printed another Rx for adderall because the last one read to fill on 04/15/2013.

## 2014-05-09 ENCOUNTER — Ambulatory Visit: Payer: Managed Care, Other (non HMO) | Admitting: Family Medicine

## 2014-05-25 ENCOUNTER — Telehealth: Payer: Self-pay | Admitting: Family Medicine

## 2014-05-25 MED ORDER — AMPHETAMINE-DEXTROAMPHETAMINE 10 MG PO TABS: 10.0000 mg | ORAL_TABLET | Freq: Every day | ORAL | Status: DC

## 2014-05-25 NOTE — Telephone Encounter (Signed)
Mr. Lady GaryDrechsler called. He  has scheduled an appt for 3/31. He only has a few days left on ADD meds  and wants to know if he can get a [facility] refill.  His phone # is, [phone].

## 2014-05-25 NOTE — Telephone Encounter (Signed)
Pt informed rx up front for p/u.Derek Berry, Derek Berry

## 2014-06-02 ENCOUNTER — Encounter: Payer: Self-pay | Admitting: Family Medicine

## 2014-06-02 ENCOUNTER — Ambulatory Visit (INDEPENDENT_AMBULATORY_CARE_PROVIDER_SITE_OTHER): Payer: Managed Care, Other (non HMO) | Admitting: Family Medicine

## 2014-06-02 VITALS — BP 135/84 | HR 96 | Ht 70.0 in | Wt 214.0 lb

## 2014-06-02 DIAGNOSIS — R350 Frequency of micturition: Secondary | ICD-10-CM

## 2014-06-02 DIAGNOSIS — F909 Attention-deficit hyperactivity disorder, unspecified type: Secondary | ICD-10-CM

## 2014-06-02 DIAGNOSIS — F988 Other specified behavioral and emotional disorders with onset usually occurring in childhood and adolescence: Secondary | ICD-10-CM

## 2014-06-02 LAB — POCT URINALYSIS DIPSTICK
BILIRUBIN UA: NEGATIVE
Blood, UA: NEGATIVE
GLUCOSE UA: NEGATIVE
KETONES UA: NEGATIVE
LEUKOCYTES UA: NEGATIVE
Nitrite, UA: NEGATIVE
Protein, UA: NEGATIVE
UROBILINOGEN UA: 0.2
pH, UA: 5.5

## 2014-06-02 MED ORDER — OMEPRAZOLE 40 MG PO CPDR: 40.0000 mg | DELAYED_RELEASE_CAPSULE | Freq: Every day | ORAL | Status: DC

## 2014-06-02 MED ORDER — AMPHETAMINE-DEXTROAMPHETAMINE 10 MG PO TABS: 10.0000 mg | ORAL_TABLET | Freq: Every day | ORAL | Status: DC

## 2014-06-02 NOTE — Progress Notes (Signed)
   Subjective:    Patient ID: Derek Berry, male    DOB: 11/21/1980, 34 y.o.   MRN: [id number]  HPI Three-month follow-up for ADD-when I last saw him reaction decrease his dose to 10 mg..  No CP, SOB or palpitations.  Took her med last this morning  They are selling his house.    Over the last year he's noticed he's had more increasing urinary frequency. No dysuria. No hematuria. He still has good force of stream. Typically only wakes up once at night to urinate. No hematuria. His father did have diabetes but he gets screened yearly for this. He says he does drink a lot of fluid though.  Review of Systems     Objective:   Physical Exam  Constitutional: He is oriented to person, place, and time. He appears well-developed and well-nourished.  HENT:  Head: Normocephalic and atraumatic.  Cardiovascular: Normal rate, regular rhythm and normal heart sounds.   Pulmonary/Chest: Effort normal and breath sounds normal.  Neurological: He is alert and oriented to person, place, and time.  Skin: Skin is warm and dry.  Psychiatric: He has a normal mood and affect. His behavior is normal.          Assessment & Plan:  ADD - doing well on current regimen. Refills for 90 days. Follow-up in 3 months.   Urinary frequency. Most consistent with overactive bladder based on his description. We'll do urinalysis today. Consider urology referral or possible medication at some point if he feels like it's becoming disruptive to lifestyle. UA was neg.

## 2014-10-21 ENCOUNTER — Ambulatory Visit (INDEPENDENT_AMBULATORY_CARE_PROVIDER_SITE_OTHER): Payer: Managed Care, Other (non HMO) | Admitting: Family Medicine

## 2014-10-21 ENCOUNTER — Encounter: Payer: Self-pay | Admitting: Family Medicine

## 2014-10-21 VITALS — BP 130/65 | HR 79 | Wt 215.0 lb

## 2014-10-21 DIAGNOSIS — F909 Attention-deficit hyperactivity disorder, unspecified type: Secondary | ICD-10-CM

## 2014-10-21 DIAGNOSIS — Z23 Encounter for immunization: Secondary | ICD-10-CM | POA: Diagnosis not present

## 2014-10-21 DIAGNOSIS — Z87891 Personal history of nicotine dependence: Secondary | ICD-10-CM

## 2014-10-21 DIAGNOSIS — F988 Other specified behavioral and emotional disorders with onset usually occurring in childhood and adolescence: Secondary | ICD-10-CM

## 2014-10-21 MED ORDER — OMEPRAZOLE 40 MG PO CPDR: 40.0000 mg | DELAYED_RELEASE_CAPSULE | Freq: Every day | ORAL | Status: DC

## 2014-10-21 MED ORDER — AMPHETAMINE-DEXTROAMPHETAMINE 10 MG PO TABS: 10.0000 mg | ORAL_TABLET | Freq: Every day | ORAL | Status: DC

## 2014-10-21 MED ORDER — AMPHETAMINE-DEXTROAMPHET ER 10 MG PO CP24: 10.0000 mg | ORAL_CAPSULE | Freq: Every day | ORAL | Status: DC

## 2014-10-21 NOTE — Progress Notes (Signed)
   Subjective:    Patient ID: Derek Berry, male    DOB: 30-Jul-1980, 34 y.o.   MRN: [id number]  HPI F/U ADD -  Says the  is working well.  He just feels like it's wearing off a little too early and wonders if he can ask her take it twice a day. He used to be on Adderall 29 g XR and felt like it was too much. Otherwise is tolerating it well without any chest pain, palpitations, shortest of breath or insomnia. No significant change in weight.  tob abuse - he actually quit smoking 2 months ago and has done well with this.  Review of Systems     Objective:   Physical Exam  Constitutional: He is oriented to person, place, and time. He appears well-developed and well-nourished.  HENT:  Head: Normocephalic and atraumatic.  Cardiovascular: Normal rate, regular rhythm and normal heart sounds.   Pulmonary/Chest: Effort normal and breath sounds normal.  Neurological: He is alert and oriented to person, place, and time.  Skin: Skin is warm and dry.  Psychiatric: He has a normal mood and affect. His behavior is normal.          Assessment & Plan:  ADD- discussed options. We'll switch him to Adderall 10 mg XR and have him follow-up in 4 months. He's having any pounds in the interim please just give [place] a call back. Prescription printed for the next 3 months.  Former smoker-changed his status. Updated his chart. Congratulated him and encouraged him to keep up the good work.  Given flu vaccine today.

## 2014-11-04 ENCOUNTER — Other Ambulatory Visit: Payer: Self-pay | Admitting: Family Medicine

## 2014-11-21 ENCOUNTER — Other Ambulatory Visit: Payer: Self-pay | Admitting: *Deleted

## 2014-11-21 MED ORDER — AMPHETAMINE-DEXTROAMPHET ER 10 MG PO CP24: 10.0000 mg | ORAL_CAPSULE | Freq: Every day | ORAL | Status: DC

## 2014-11-21 NOTE — Telephone Encounter (Signed)
Pt lost his rx's.Derek Berry

## 2015-01-20 ENCOUNTER — Ambulatory Visit: Payer: Managed Care, Other (non HMO) | Admitting: Family Medicine

## 2015-01-23 ENCOUNTER — Other Ambulatory Visit: Payer: Self-pay

## 2015-01-23 MED ORDER — AMPHETAMINE-DEXTROAMPHET ER 10 MG PO CP24: 10.0000 mg | ORAL_CAPSULE | Freq: Every day | ORAL | Status: DC

## 2015-02-20 ENCOUNTER — Encounter: Payer: Self-pay | Admitting: Family Medicine

## 2015-02-20 ENCOUNTER — Ambulatory Visit (INDEPENDENT_AMBULATORY_CARE_PROVIDER_SITE_OTHER): Payer: Managed Care, Other (non HMO) | Admitting: Family Medicine

## 2015-02-20 VITALS — BP 132/85 | HR 78 | Wt 219.0 lb

## 2015-02-20 DIAGNOSIS — F988 Other specified behavioral and emotional disorders with onset usually occurring in childhood and adolescence: Secondary | ICD-10-CM

## 2015-02-20 DIAGNOSIS — Z0000 Encounter for general adult medical examination without abnormal findings: Secondary | ICD-10-CM | POA: Diagnosis not present

## 2015-02-20 DIAGNOSIS — F909 Attention-deficit hyperactivity disorder, unspecified type: Secondary | ICD-10-CM | POA: Diagnosis not present

## 2015-02-20 MED ORDER — AMPHETAMINE-DEXTROAMPHET ER 15 MG PO CP24: 15.0000 mg | ORAL_CAPSULE | Freq: Every day | ORAL | Status: DC

## 2015-02-20 NOTE — Patient Instructions (Signed)
Keep up a regular exercise program and make sure you are eating a healthy diet Try to eat 4 servings of dairy a day, or if you are lactose intolerant take a calcium with vitamin D daily.  Your vaccines are up to date.   

## 2015-02-20 NOTE — Progress Notes (Signed)
Subjective:    Patient ID: Derek Berry, male    DOB: [birth year], 34 y.o.   MRN: [id number]  HPI Here for CPE today. No specific complaints.  Recently moved to [place].     ADD- he feels like the 10 mg XL was not quite as effective as taking the 10 mg twice a day ose. He would like to consider switching back to that. He said when he previously took 20 mg extended release he didn't like how he felt on it.  Review of Systems   conference review of systems is negative today. dear  BP 132/85 mmHg  Pulse 78  Wt 219 lb (99.338 kg)  SpO2 97%    Allergies  Allergen Reactions  . Sulfonamide Derivatives     Past Medical History  Diagnosis Date  . Seasonal allergies   . GERD (gastroesophageal reflux disease)   . Hypertension   . Diabetes mellitus   . Alcoholism [facility])     Past Surgical History  Procedure Laterality Date  . Hernia repair    . Tonsillectomy    . Adenoidectomy    . Wisdom tooth extraction      Social History   Social History  . Marital Status: Single    Spouse Name: N/A  . Number of Children: N/A  . Years of Education: N/A   Occupational History  . Not on file.   Social History Main Topics  . Smoking status: Former Smoker -- 0.25 packs/day    Types: Cigarettes    Quit date: 08/21/2014  . Smokeless tobacco: Not on file  . Alcohol Use: 0.0 oz/week    0 Standard drinks or equivalent per week  . Drug Use: No  . Sexual Activity: Not on file   Other Topics Concern  . Not on file   Social History Narrative    Family History  Problem Relation Age of Onset  . Diabetes Mother   . Heart failure Other   . Diabetes Father   . Hypertension Father   . Alcohol abuse Father     Outpatient Encounter Prescriptions as of 02/20/2015  Medication Sig  . amphetamine-dextroamphetamine (ADDERALL XR) 10 MG 24 hr capsule Take 1 capsule (10 mg total) by mouth daily.  Marland Kitchen omeprazole (PRILOSEC) 40 MG capsule Take 1 capsule (40 mg total) by mouth daily.  .  [DISCONTINUED] omeprazole (PRILOSEC) 40 MG capsule TAKE 1 CAPSULE (40 MG TOTAL) BY MOUTH DAILY.   No facility-administered encounter medications on file as of 02/20/2015.           Objective:   Physical Exam  Constitutional: He is oriented to person, place, and time. He appears well-developed and well-nourished.  HENT:  Head: Normocephalic and atraumatic.  Right Ear: External ear normal.  Left Ear: External ear normal.  Nose: Nose normal.  Mouth/Throat: Oropharynx is clear and moist.  Eyes: Conjunctivae and EOM are normal. Pupils are equal, round, and reactive to light.  Neck: Normal range of motion. Neck supple. No thyromegaly present.  Cardiovascular: Normal rate, regular rhythm, normal heart sounds and intact distal pulses.   Pulmonary/Chest: Effort normal and breath sounds normal.  Abdominal: Soft. Bowel sounds are normal. He exhibits no distension and no mass. There is no tenderness. There is no rebound and no guarding.  Musculoskeletal: Normal range of motion.  Lymphadenopathy:    He has no cervical adenopathy.  Neurological: He is alert and oriented to person, place, and time. He has normal reflexes.  Skin: Skin is warm and  dry.  Psychiatric: He has a normal mood and affect. His behavior is normal. Judgment and thought content normal.          Assessment & Plan:  Keep up a regular exercise program and make sure you are eating a healthy diet Try to eat 4 servings of dairy a day, or if you are lactose intolerant take a calcium with vitamin D daily.  Your vaccines are up to date.    ADD -  Discussed options. We can increase the viral XR to 15 mg or we can get back to the 10 mg regular release twice a day. He would like to start with trying to 15 mg extended release first. I wil see him back in 4 moths.

## 2015-03-23 ENCOUNTER — Other Ambulatory Visit: Payer: Self-pay | Admitting: Family Medicine

## 2015-03-23 NOTE — Telephone Encounter (Signed)
Pt called to state the increase in his Adderall has been beneficial for him. Pt states he was supposed to try the new dose for one month, then call for next two Rx refills. Will route to PCP.  Pt also states he discussed his back pain with PCP at last visit and he denied a muscle relaxer Rx at that time. Pt now is ready to get a muscle relaxer because his back pain is not getting better, will route to PCP for review on new Rx.

## 2015-03-24 MED ORDER — AMPHETAMINE-DEXTROAMPHET ER 15 MG PO CP24: 15.0000 mg | ORAL_CAPSULE | Freq: Every day | ORAL | Status: DC

## 2015-03-24 MED ORDER — AMPHETAMINE-DEXTROAMPHET ER 15 MG PO CP24
15.0000 mg | ORAL_CAPSULE | Freq: Every day | ORAL | Status: DC
Start: 2015-03-24 — End: 2015-03-24

## 2015-03-24 MED ORDER — TIZANIDINE HCL 4 MG PO CAPS: 4.0000 mg | ORAL_CAPSULE | Freq: Two times a day (BID) | ORAL | Status: DC | PRN

## 2015-03-24 NOTE — Telephone Encounter (Signed)
Pt advised of new Rx's and that Adderall is ready for pick up.

## 2015-03-24 NOTE — Telephone Encounter (Signed)
OK rx printed for Adderall and rx sent for Zanaflex.

## 2015-05-15 ENCOUNTER — Ambulatory Visit (INDEPENDENT_AMBULATORY_CARE_PROVIDER_SITE_OTHER): Payer: Managed Care, Other (non HMO) | Admitting: Family Medicine

## 2015-05-15 ENCOUNTER — Encounter: Payer: Self-pay | Admitting: Family Medicine

## 2015-05-15 VITALS — BP 135/77 | HR 84 | Wt 216.0 lb

## 2015-05-15 DIAGNOSIS — F909 Attention-deficit hyperactivity disorder, unspecified type: Secondary | ICD-10-CM

## 2015-05-15 DIAGNOSIS — S93491A Sprain of other ligament of right ankle, initial encounter: Secondary | ICD-10-CM | POA: Diagnosis not present

## 2015-05-15 DIAGNOSIS — K219 Gastro-esophageal reflux disease without esophagitis: Secondary | ICD-10-CM | POA: Insufficient documentation

## 2015-05-15 DIAGNOSIS — F988 Other specified behavioral and emotional disorders with onset usually occurring in childhood and adolescence: Secondary | ICD-10-CM

## 2015-05-15 MED ORDER — TIZANIDINE HCL 4 MG PO CAPS: 4.0000 mg | ORAL_CAPSULE | Freq: Two times a day (BID) | ORAL | Status: DC | PRN

## 2015-05-15 MED ORDER — AMPHETAMINE-DEXTROAMPHET ER 15 MG PO CP24: 15.0000 mg | ORAL_CAPSULE | Freq: Every day | ORAL | Status: DC

## 2015-05-15 NOTE — Progress Notes (Signed)
Subjective:    Patient ID: Derek Berry, male    DOB: 11/14/1980, 35 y.o.   MRN: [id number]  HPI F/U ADD - Doing well on current regimen of Adderall XR 15 mg daily. We opted to inc his dose in Feb.  No chest pain, shortness breath, palpitations, or insomnia. No significant weight changes.   Also having some pain over the front top of the right foot. Started  1.5 weeks ago. Notices it more is he has his foot bent to the side.  He does drive for long periods for work and notices it aggravates him more than. He denies any acute injury or trauma or any old injuries that he is aware of. He does have arthritis and several other joints.  Review of Systems  BP 135/77 mmHg  Pulse 84  Wt 216 lb (97.977 kg)  SpO2 99%    Allergies  Allergen Reactions  . Sulfonamide Derivatives     Past Medical History  Diagnosis Date  . Seasonal allergies   . GERD (gastroesophageal reflux disease)   . Hypertension   . Diabetes mellitus   . Alcoholism [facility])     Past Surgical History  Procedure Laterality Date  . Hernia repair    . Tonsillectomy    . Adenoidectomy    . Wisdom tooth extraction      Social History   Social History  . Marital Status: Single    Spouse Name: N/A  . Number of Children: 3  . Years of Education: N/A   Occupational History  . Not on file.   Social History Main Topics  . Smoking status: Former Smoker -- 0.25 packs/day    Types: Cigarettes    Quit date: 08/21/2014  . Smokeless tobacco: Not on file  . Alcohol Use: 0.0 oz/week    0 Standard drinks or equivalent per week  . Drug Use: No  . Sexual Activity: Not on file   Other Topics Concern  . Not on file   Social History Narrative   No regular exercise. Lives in [place].     Family History  Problem Relation Age of Onset  . Diabetes Mother   . Heart failure Other   . Diabetes Father   . Hypertension Father   . Alcohol abuse Father     Outpatient Encounter Prescriptions as of 05/15/2015  Medication  Sig  . amphetamine-dextroamphetamine (ADDERALL XR) 15 MG 24 hr capsule Take 1 capsule by mouth daily.  Marland Kitchen. omeprazole (PRILOSEC) 40 MG capsule Take 1 capsule (40 mg total) by mouth daily.  Marland Kitchen. tiZANidine (ZANAFLEX) 4 MG capsule Take 1 capsule (4 mg total) by mouth 2 (two) times daily as needed for muscle spasms.  . [DISCONTINUED] amphetamine-dextroamphetamine (ADDERALL XR) 15 MG 24 hr capsule Take 1 capsule by mouth daily. OK to fill 04/24/15.  . [DISCONTINUED] amphetamine-dextroamphetamine (ADDERALL XR) 15 MG 24 hr capsule Take 1 capsule by mouth daily. Ok to fill 07/15/2015  . [DISCONTINUED] amphetamine-dextroamphetamine (ADDERALL XR) 15 MG 24 hr capsule Take 1 capsule by mouth daily. Ok to fill 06/15/2015  . [DISCONTINUED] tiZANidine (ZANAFLEX) 4 MG capsule Take 1 capsule (4 mg total) by mouth 2 (two) times daily as needed for muscle spasms.   No facility-administered encounter medications on file as of 05/15/2015.          Objective:   Physical Exam  Constitutional: He is oriented to person, place, and time. He appears well-developed and well-nourished.  HENT:  Head: Normocephalic and atraumatic.  Cardiovascular: Normal  rate, regular rhythm and normal heart sounds.   Pulmonary/Chest: Effort normal and breath sounds normal.  Neurological: He is alert and oriented to person, place, and time.  Skin: Skin is warm and dry.  Psychiatric: He has a normal mood and affect. His behavior is normal.    Right ankle with normal range of motion. Strength is 5 out of 5 in all directions. Tenderness over the AT ligament      Assessment & Plan:  ADD -  Well controlled Rx printed for next 90 day. F/U in 4 months.    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 Avoiding keeping the foot in one position for long time especially with driving. It may also be developing some early arthritis. Take anti-inflammatory as needed. Recommend further evaluation if not improving with stretches and  exercises and anti-inflammatory.    Reminded him again that he does need to go for blood work. Lab slip reprinted and given again.

## 2015-06-12 ENCOUNTER — Encounter: Payer: Self-pay | Admitting: Physician Assistant

## 2015-06-12 ENCOUNTER — Ambulatory Visit (INDEPENDENT_AMBULATORY_CARE_PROVIDER_SITE_OTHER): Payer: Managed Care, Other (non HMO) | Admitting: Physician Assistant

## 2015-06-12 ENCOUNTER — Encounter: Payer: Managed Care, Other (non HMO) | Admitting: Sports Medicine

## 2015-06-12 VITALS — BP 123/73 | HR 70 | Ht 70.0 in | Wt 219.0 lb

## 2015-06-12 DIAGNOSIS — A499 Bacterial infection, unspecified: Secondary | ICD-10-CM | POA: Diagnosis not present

## 2015-06-12 DIAGNOSIS — J329 Chronic sinusitis, unspecified: Secondary | ICD-10-CM | POA: Diagnosis not present

## 2015-06-12 DIAGNOSIS — B9689 Other specified bacterial agents as the cause of diseases classified elsewhere: Secondary | ICD-10-CM

## 2015-06-12 MED ORDER — AMOXICILLIN-POT CLAVULANATE 875-125 MG PO TABS: 1.0000 | ORAL_TABLET | Freq: Two times a day (BID) | ORAL | Status: DC

## 2015-06-12 NOTE — Patient Instructions (Signed)

## 2015-06-13 NOTE — Progress Notes (Signed)
   Subjective:    Patient ID: Derek Berry, male    DOB: 07/21/1980, 35 y.o.   MRN: [id number]  HPI Pt presents to the clinic with 10 days of sinus pressure, facial pain, congestion, ear popping. He thought was more allergy related and started claritin daily. Then switched to mucinex. He thought he might be getting better but then pressure got worse. He has greenish sinus discharge. He has a dry cough. No fever, chills, body aches, SOB or wheezing.    Review of Systems See HPI.    Objective:   Physical Exam  Constitutional: He is oriented to person, place, and time. He appears well-developed and well-nourished.  HENT:  Head: Normocephalic and atraumatic.  Right Ear: External ear normal.  Left Ear: External ear normal.  Mouth/Throat: Oropharynx is clear and moist. No oropharyngeal exudate.  TM's erythematous.  Bilateral nasal turbinates red and swollen.  Tenderness over frontal and maxillary sinuses   Eyes: Conjunctivae are normal. Right eye exhibits no discharge. Left eye exhibits no discharge.  Neck: Normal range of motion. Neck supple.  Cardiovascular: Normal rate, regular rhythm and normal heart sounds.   Pulmonary/Chest: Effort normal and breath sounds normal. He has no wheezes.  Lymphadenopathy:    He has cervical adenopathy.  Neurological: He is alert and oriented to person, place, and time.  Psychiatric: He has a normal mood and affect. His behavior is normal.          Assessment & Plan:  Bacterial sinusitis- treated with augmentin for 10 days. Encouraged flonase daily. Other symptomatic care discussed. Follow up as needed.

## 2015-07-03 ENCOUNTER — Encounter: Payer: Self-pay | Admitting: Sports Medicine

## 2015-07-03 ENCOUNTER — Ambulatory Visit (INDEPENDENT_AMBULATORY_CARE_PROVIDER_SITE_OTHER): Payer: Managed Care, Other (non HMO) | Admitting: Sports Medicine

## 2015-07-03 VITALS — BP 121/75 | HR 84 | Resp 18 | Wt 217.7 lb

## 2015-07-03 DIAGNOSIS — M79671 Pain in right foot: Secondary | ICD-10-CM | POA: Diagnosis not present

## 2015-07-03 DIAGNOSIS — M79672 Pain in left foot: Secondary | ICD-10-CM

## 2015-07-03 NOTE — Progress Notes (Signed)

## 2015-07-03 NOTE — Assessment & Plan Note (Signed)
Derek Berry does have pes cavus, custom orthotics as above, he will touch base with me in 2-4 weeks via email to let me know how he is doing.

## 2015-07-07 ENCOUNTER — Ambulatory Visit (INDEPENDENT_AMBULATORY_CARE_PROVIDER_SITE_OTHER): Payer: Managed Care, Other (non HMO) | Admitting: Physician Assistant

## 2015-07-07 ENCOUNTER — Encounter: Payer: Self-pay | Admitting: Physician Assistant

## 2015-07-07 VITALS — BP 147/68 | HR 75 | Ht 70.0 in | Wt 218.0 lb

## 2015-07-07 DIAGNOSIS — J029 Acute pharyngitis, unspecified: Secondary | ICD-10-CM

## 2015-07-07 DIAGNOSIS — E669 Obesity, unspecified: Secondary | ICD-10-CM

## 2015-07-07 DIAGNOSIS — R000 Tachycardia, unspecified: Secondary | ICD-10-CM | POA: Diagnosis not present

## 2015-07-07 DIAGNOSIS — R635 Abnormal weight gain: Secondary | ICD-10-CM

## 2015-07-07 LAB — CBC WITH DIFFERENTIAL/PLATELET
BASOS ABS: 0 {cells}/uL (ref 0–200)
BASOS PCT: 0 %
EOS ABS: 64 {cells}/uL (ref 15–500)
Eosinophils Relative: 1 %
HEMATOCRIT: 45.6 % (ref 38.5–50.0)
Hemoglobin: 16.1 g/dL (ref 13.2–17.1)
LYMPHS PCT: 25 %
Lymphs Abs: 1600 cells/uL (ref 850–3900)
MCH: 32.1 pg (ref 27.0–33.0)
MCHC: 35.3 g/dL (ref 32.0–36.0)
MCV: 90.8 fL (ref 80.0–100.0)
MONO ABS: 768 {cells}/uL (ref 200–950)
MONOS PCT: 12 %
MPV: 11 fL (ref 7.5–12.5)
NEUTROS PCT: 62 %
Neutro Abs: 3968 cells/uL (ref 1500–7800)
PLATELETS: 184 10*3/uL (ref 140–400)
RBC: 5.02 MIL/uL (ref 4.20–5.80)
RDW: 13.7 % (ref 11.0–15.0)
WBC: 6.4 10*3/uL (ref 3.8–10.8)

## 2015-07-07 MED ORDER — LORCASERIN HCL 10 MG PO TABS: 1.0000 | ORAL_TABLET | Freq: Two times a day (BID) | ORAL | Status: DC

## 2015-07-07 NOTE — Progress Notes (Addendum)
   Subjective:    Patient ID: Derek Berry, male    DOB: 06/28/1980, 35 y.o.   MRN: [id number]  HPI Patient is here today complaining of sore throat, runny nose, and cough for the past 2 weeks. He was seen 06/13/15 and diagnosed with sinusitis and given a 10 day course of Amoxicillin. He states he felt a little bit better while taking the medication, but never felt 100% and two days after completing the medicine, he started having these current symptoms. He was having a very bad dry cough resulting in 45 minute long coughing fits, but states that has improved over the last 4 days. He is mainly complaining of painful sore throat and and a sore tongue. Patient states he has had sharp throat pains and a sore tongue for the past 3-4 days, but was especially painful/sore yesterday. He denies shortness of breath, chest tightness, wheezing, fever, vomiting, and diarrhea.   Patient is interested in weight loss medication. Will discussing side effects of Phentermine, patient reported episodes of rapid heart beat reaching 220 beats/min at times. Valsalva maneuvers do resolve these episodes. On average having episodes every 2 weeks.    Review of Systems     Objective:   Physical Exam  Constitutional: He appears well-developed and well-nourished.  HENT:  Head: Normocephalic and atraumatic.  Right Ear: Tympanic membrane is erythematous. Tympanic membrane is not retracted and not bulging.  Left Ear: Tympanic membrane is erythematous. Tympanic membrane is not retracted and not bulging.  Nose: Rhinorrhea present. Right sinus exhibits no maxillary sinus tenderness and no frontal sinus tenderness. Left sinus exhibits no maxillary sinus tenderness and no frontal sinus tenderness.  Eyes: Conjunctivae are normal. Right eye exhibits no discharge. Left eye exhibits no discharge.  Cardiovascular: Normal rate, regular rhythm and normal heart sounds.   Pulmonary/Chest: Effort normal and breath sounds normal.   Lymphadenopathy:    He has cervical adenopathy (Nontender).          Assessment & Plan:  1. Sore throat- Patient reports two week history of sore throat that has worsened over the last 3-4 days. Rapid strep test was negative. I am going to order a CBC, but I believe this may be allergy related. I recommend that he begin daily Zyrtec and continue daily Flonase. He is going to try salt water gurgles and ibuprofen 600mg  2x daily for the next three days. If no improvement, he will call office to discuss further treatment.    2. Obestity- Patient is interested in weight loss medications. Because patient is taking Adderall and having rapid pulse, we are going to try Belviq at this time. Discussed diet modifications and exercise. Follow up in 3 months.   3. Increased heart rate- Patient reports episodes of rapid heart rate approximately every 2 weeks. At times his pulse reaches 220 beats/min. He states after doing research, he tried valsalva maneuvers which caused these episodes to cease. SVT is likely, but given that he is on Adderall, we discussed coming off of the Adderall and switching to Stratera to determine if these episodes are due to medication side effect.  Follow up with PCP in 1 month.  Discussed holter monitor but would like to hold off at this time. Consideration of beta blocker in the future.

## 2015-07-07 NOTE — Addendum Note (Signed)
Addended by: Jomarie LongsBREEBACK, Demitrios Molyneux L on: 07/07/2015 12:25 PM   Modules accepted: Level of Service

## 2015-07-07 NOTE — Patient Instructions (Addendum)
Zyrtec at bedtime.  Continue flonase.  Salt water gargles and ibuprofen 600mg  at least twice a day for 3 days.   STRATTERA  Paroxysmal Supraventricular Tachycardia Paroxysmal supraventricular tachycardia (PSVT) is a type of abnormal heart rhythm. It causes your heart to beat very quickly and then suddenly stop beating so quickly. A normal heart rate is 60-100 beats per minute. During an episode of PSVT, your heart rate may be 150-250 beats per minute. This can make you feel light-headed and short of breath. An episode of PSVT can be frightening. It is usually not dangerous. The heart has four chambers. All chambers need to work together for the heart to beat effectively. A normal heartbeat usually starts in the right upper chamber of the heart (atrium) when an area (sinoatrial node) puts out an electrical signal that spreads to the other chambers. People with PSVT may have abnormal electrical pathways, or they may have other areas in the upper chambers that send out electrical signals. The result is a very rapid heartbeat. When your heart beats very quickly, it does not have time to fill completely with blood. When PSVT happens often or it lasts for long periods, it can lead to heart weakness and failure. Most people with PSVT do not have any other heart disease. CAUSES Abnormal electrical activity in the heart causes PSVT. It is not known why some people get PSVT and others do not. RISK FACTORS You may be more likely to have PSVT if:  You are 6820-35 years old.  You are a woman. Other factors that may increase your chances of an attack include:  Stress.  Being tired.  Smoking.  Stimulant drugs.  Alcoholic drinks.  Caffeine.  Pregnancy. SIGNS AND SYMPTOMS A mild episode of PSVT may cause no symptoms. If you do have signs and symptoms, they may include:  A pounding heart.  Feeling of skipped heartbeats (palpitations).  Weakness.  Shortness of breath.  Tightness or pain in  your chest.  Light-headedness.  Anxiety.  Dizziness.  Sweating.  Nausea.  A fainting spell. DIAGNOSIS Your health care provider may suspect PSVT if you have symptoms that come and go. The health care provider will do a physical exam. If you are having an episode during the exam, the health care provider may be able to diagnose PSVT by listening to your heart and feeling your pulse. Tests may also be done, including:  An electrical study of your heart (electrocardiogram, or ECG).  A test in which you wear a portable ECG monitor all day (Holter monitor) or for several days (event monitor).  A test that involves taking an image of your heart using sound waves (echocardiogram) to rule out other causes of a fast heart rate. TREATMENT You may not need treatment if episodes of PSVT do not happen often or if they do not cause symptoms. If PSVT episodes do cause symptoms, your health care provider may first suggest trying a self-treatment called vagus nerve stimulation. The vagus nerve extends down from the brain. It regulates certain body functions. Stimulating this nerve can slow down the heart. Your health care provider can teach you ways to do this. You may need to try a few ways to find what works best for you. Options include:  Holding your breath and pushing, as though you are having a bowel movement.  Massaging an area on one side of your neck below your jaw.  Bending forward with your head between your legs.  Bending forward with your head  between your legs and coughing.  Massaging your eyeballs with your eyes closed. If vagus nerve stimulation does not work, other treatment options include:  Medicines to prevent an attack.  Being treated in the hospital with medicine or electric shock to stop an attack (cardioversion). This treatment can include:  Getting medicine through an IV line.  Having a small electric shock delivered to your heart. You will be given medicine to make  you sleep through this procedure.  If you have frequent episodes with symptoms, you may need a procedure to get rid of the faulty areas of your heart (radiofrequency ablation) and end the episodes of PSVT. In this procedure:  A long, thin tube (catheter) is passed through one of your veins into your heart.  Energy directed through the catheter eliminates the areas of your heart that are causing abnormal electric stimulation. HOME CARE INSTRUCTIONS  Take medicines only as directed by your health care provider.  Do not use caffeine in any form if caffeine triggers episodes of PSVT. Otherwise, consume caffeine in moderation. This means no more than a few cups of coffee or the equivalent each day.  Do not drink alcohol if alcohol triggers episodes of PSVT. Otherwise, limit alcohol intake to no more than 1 drink per day for nonpregnant women and 2 drinks per day for men. One drink equals 12 ounces of beer, 5 ounces of wine, or 1 ounces of hard liquor.  Do not use any tobacco products, including cigarettes, chewing tobacco, or electronic cigarettes. If you need help quitting, ask your health care provider.  Try to get at least 7 hours of sleep each night.  Find healthy ways to manage stress.  Perform vagus nerve stimulation as directed by your health care provider.  Maintain a healthy weight.  Get some exercise on most days. Ask your health care provider to suggest some good activities for you. SEEK MEDICAL CARE IF:  You are having episodes of PSVT more often, or they are lasting longer.  Vagus nerve stimulation is no longer helping.  You have new symptoms during an episode. SEEK IMMEDIATE MEDICAL CARE IF:  You have chest pain or trouble breathing.  You have an episode of PSVT that has lasted longer than 20 minutes.  You have passed out from an episode of PSVT. These symptoms may represent a serious problem that is an emergency. Do not wait to see if the symptoms will go away. Get  medical help right away. Call your local emergency services (911 in the [place] Do not drive yourself to the hospital.   This information is not intended to replace advice given to you by your health care provider. Make sure you discuss any questions you have with your health care provider.   Document Released: 02/18/2005 Document Revised: 03/11/2014 Document Reviewed: 07/29/2013 Elsevier Interactive Patient Education [employer]

## 2015-07-11 ENCOUNTER — Telehealth: Payer: Self-pay | Admitting: *Deleted

## 2015-07-11 NOTE — Telephone Encounter (Signed)
PA initiated for belviq  Key: [id number] - PA Case ID: [id number]

## 2015-07-13 NOTE — Telephone Encounter (Signed)
Medication denied. There is a policy exclusion on patient's pharm benefit plan.denial letter placed in box

## 2015-07-18 ENCOUNTER — Telehealth: Payer: Self-pay | Admitting: Family Medicine

## 2015-07-18 NOTE — Telephone Encounter (Signed)
Left information on Pt's VM. Callback provided to inform if Pt would like referral placed.

## 2015-07-18 NOTE — Telephone Encounter (Signed)
Please call patient: Authorization was denied for the Belviq from his insurance company. It says it is a plan exclusion which makes me think that they likely will not pay for any type of weight loss drugs. I would be happy to refer him to a nutritionist to help him create a specialized dietary plan to help maximize energy input and output.

## 2015-07-27 NOTE — Telephone Encounter (Signed)
Please call one more time.

## 2015-07-28 NOTE — Telephone Encounter (Signed)
Attempted to contact Pt again, call went straight to VM. Left message to return clinic call regarding Rx denial.

## 2015-08-14 ENCOUNTER — Encounter: Payer: Self-pay | Admitting: Family Medicine

## 2015-08-14 ENCOUNTER — Ambulatory Visit (INDEPENDENT_AMBULATORY_CARE_PROVIDER_SITE_OTHER): Payer: Managed Care, Other (non HMO) | Admitting: Family Medicine

## 2015-08-14 VITALS — BP 136/82 | HR 75 | Wt 219.0 lb

## 2015-08-14 DIAGNOSIS — F988 Other specified behavioral and emotional disorders with onset usually occurring in childhood and adolescence: Secondary | ICD-10-CM

## 2015-08-14 DIAGNOSIS — R002 Palpitations: Secondary | ICD-10-CM

## 2015-08-14 DIAGNOSIS — F909 Attention-deficit hyperactivity disorder, unspecified type: Secondary | ICD-10-CM | POA: Diagnosis not present

## 2015-08-14 DIAGNOSIS — Z114 Encounter for screening for human immunodeficiency virus [HIV]: Secondary | ICD-10-CM

## 2015-08-14 LAB — COMPLETE METABOLIC PANEL WITH GFR
ALBUMIN: 4.7 g/dL (ref 3.6–5.1)
ALK PHOS: 82 U/L (ref 40–115)
ALT: 31 U/L (ref 9–46)
AST: 20 U/L (ref 10–40)
BILIRUBIN TOTAL: 0.8 mg/dL (ref 0.2–1.2)
BUN: 11 mg/dL (ref 7–25)
CO2: 28 mmol/L (ref 20–31)
Calcium: 9.7 mg/dL (ref 8.6–10.3)
Chloride: 102 mmol/L (ref 98–110)
Creat: 1.05 mg/dL (ref 0.60–1.35)
GFR, Est African American: >89 mL/min (ref >=60)
GLUCOSE: 73 mg/dL (ref 65–99)
Potassium: 4.5 mmol/L (ref 3.5–5.3)
SODIUM: 139 mmol/L (ref 135–146)
TOTAL PROTEIN: 7 g/dL (ref 6.1–8.1)

## 2015-08-14 LAB — TSH: TSH: 0.86 mIU/L (ref 0.40–4.50)

## 2015-08-14 NOTE — Progress Notes (Signed)
Subjective:    CC: ADD  HPI:  F/U ADD - He is not longer Adderral. Says it was causing hsi heart to race.  Says he has felt better off of it but then sat he was working out in the yard, cam in and took a cool shower.  He says as he was getting in the shower he started to feel his heart race. He has had this before. Though this time it actually lasted him is 15 minutes. He had read online that you can Valsalva and there down. He says that has worked in the past to stop the fast heart rate. This time it to, most 15 minutes to get it to stop. Then laid down and had another episode of heart racing. That time he was able to get it to go away after a minute or 2. He says it can happen randomly though it typically does occur either during or after activity. He admits he doesn't hydrate well most days. He says it can be months in between episodes.   Past medical history, Surgical history, Family history not pertinant except as noted below, Social history, Allergies, and medications have been entered into the medical record, reviewed, and corrections made.   Review of Systems: No fevers, chills, night sweats, weight loss, chest pain, or shortness of breath.   Objective:    General: Well Developed, well nourished, and in no acute distress.  Neuro: Alert and oriented x3, extra-ocular muscles intact, sensation grossly intact.  HEENT: Normocephalic, atraumatic  Skin: Warm and dry, no rashes. Cardiac: Regular rate and rhythm, no murmurs rubs or gallops, no lower extremity edema.  Respiratory: Clear to auscultation bilaterally. Not using accessory muscles, speaking in full sentences.   Impression and Recommendations:   ADD - At this point we'll hold off on any stimulant medications. We can certainly discuss non-stimulant treatments in the future. Remarried Adderall from his medication list. I think it's best that we avoid this as this can trigger the palpitations.  Palpitations -  EKG shows rate of 68  bpm, normal sinus rhythm with normal axis and no acute ST-T wave changes. No J-point elevation. At this point I'll refer him to cardiology for further evaluation. Normally I would consider a Holter monitor but the episodes occur so infrequently then I think further evaluation by cardiology is warranted. In the short-term we will at least check electrolytes as well as thyroid function. I did encourage him to work on hydrating well.

## 2015-08-15 LAB — HIV ANTIBODY (ROUTINE TESTING W REFLEX): HIV 1&2 Ab, 4th Generation: NONREACTIVE

## 2015-08-15 NOTE — Addendum Note (Signed)
Addended by: Nani GasserMETHENEY, Jhayla Podgorski D on: 08/15/2015 07:53 AM   Modules accepted: Orders

## 2015-09-11 ENCOUNTER — Telehealth: Payer: Self-pay

## 2015-09-11 NOTE — Telephone Encounter (Signed)
Called patient to update medical, left message for patient to return call

## 2015-09-18 ENCOUNTER — Ambulatory Visit: Payer: Managed Care, Other (non HMO) | Admitting: Internal Medicine

## 2015-10-23 ENCOUNTER — Telehealth: Payer: Self-pay

## 2015-10-23 NOTE — Telephone Encounter (Signed)
I agree let's make sure Dr. Gary FleetWhalen is ok with it.

## 2015-10-23 NOTE — Telephone Encounter (Signed)
Patient called and states he did see the cardiologist for his palpitations. He states the cardiologist cleared him to take ADHD medications/stimulants and he would like a refill. I looked over the note from Dr Gary FleetWhalen and I didn't see that he stated he could take the medication. I left a message with his triage nurse asking if Dr Gary FleetWhalen would be ok with him restating the medication. He needs a refill. Please advise. I will note in the chart when Dr Shon BatonWhalen's triage nurse returns the call.     Derek Berry, M.D.  Cardiology Department: [phone]

## 2015-10-25 NOTE — Telephone Encounter (Signed)
Please see if he has tried methylphenidate before. It is still a stimulant that works a little bit differently. He may do better and not have the palpitations that he was experiencing on the Adderall.

## 2015-10-25 NOTE — Telephone Encounter (Signed)
Derek Berry from Dr Shon BatonWhalen's office called and left a message stating it was ok to restart ADHD medication. Medication is no longer on medication list.

## 2015-10-26 NOTE — Telephone Encounter (Signed)
Left message for a return call

## 2015-10-26 NOTE — Telephone Encounter (Signed)
Derek Berry has never tried the methylphenidate but is willing to try.

## 2015-10-27 ENCOUNTER — Other Ambulatory Visit: Payer: Self-pay

## 2015-10-27 MED ORDER — OMEPRAZOLE 40 MG PO CPDR: 40.0000 mg | DELAYED_RELEASE_CAPSULE | Freq: Every day | ORAL | 3 refills | Status: AC

## 2015-10-27 MED ORDER — METHYLPHENIDATE HCL ER (OSM) 18 MG PO TBCR: 18.0000 mg | EXTENDED_RELEASE_TABLET | Freq: Every day | ORAL | 0 refills | Status: AC

## 2015-10-27 NOTE — Telephone Encounter (Signed)
rx ready to be picked up

## 2015-10-30 NOTE — Telephone Encounter (Signed)
Patient has picked up prescription.

## 2018-01-05 LAB — HEMOGLOBIN A1C
Est. Avg. Glucose, WB: 105
Est. Avg. Glucose-calculated: 111
Hemoglobin A1C: 5.3 % (ref 4.0–6.0)

## 2018-01-06 LAB — COMPREHENSIVE METABOLIC PANEL
ALT: 38 U/L (ref 0–41)
AST: 19 U/L (ref 0–40)
Albumin/Globulin Ratio: 2.3 mmol/L — ABNORMAL HIGH (ref 1.00–2.00)
Albumin: 5.1 g/dL (ref 3.5–5.2)
Alk Phosphatase: 84 U/L (ref 40–130)
Anion Gap: 11 mmol/L (ref 2–17)
BUN: 19 mg/dL (ref 6–20)
CO2: 27 mmol/L (ref 22–29)
Calcium: 9.6 mg/dL (ref 8.6–10.0)
Chloride: 103 mmol/L (ref 98–107)
Creatinine: 0.9 mg/dL (ref 0.7–1.3)
GFR African American: 126 mL/min/{1.73_m2} (ref >=90)
GFR Non-African American: 109 mL/min/{1.73_m2} (ref >=90)
Globulin: 2 g/dL (ref 1.9–4.4)
Glucose: 94 mg/dL (ref 70–99)
OSMOLALITY CALCULATED: 283 mOsm/kg (ref 270–287)
Potassium: 4.6 mmol/L (ref 3.5–5.3)
Sodium: 141 mmol/L (ref 135–145)
Total Bilirubin: 0.3 mg/dL (ref 0.00–1.20)
Total Protein: 7.3 g/dL (ref 6.4–8.3)

## 2018-01-06 LAB — LIPID PANEL
Chol/HDL Ratio: 5.3 — ABNORMAL HIGH (ref 0.0–4.4)
Cholesterol: 218 mg/dL — ABNORMAL HIGH (ref 100–200)
HDL: 41 mg/dL — ABNORMAL LOW (ref 55–72)
LDL Cholesterol: 127 mg/dL — ABNORMAL HIGH (ref 0.0–100.0)
LDL/HDL Ratio: 3.1
Triglycerides: 248 mg/dL — ABNORMAL HIGH (ref 0–149)
VLDL: 49.6 mg/dL — ABNORMAL HIGH (ref 5.0–40.0)

## 2019-01-18 LAB — COMPREHENSIVE METABOLIC PANEL
ALT: 45 U/L — ABNORMAL HIGH (ref 0–41)
AST: 24 U/L (ref 0–40)
Albumin/Globulin Ratio: 2 mmol/L (ref 1.00–2.00)
Albumin: 4.8 g/dL (ref 3.5–5.2)
Alk Phosphatase: 94 U/L (ref 40–130)
Anion Gap: 13 mmol/L (ref 2–17)
BUN: 14 mg/dL (ref 6–20)
CO2: 25 mmol/L (ref 22–29)
Calcium: 9.9 mg/dL (ref 8.6–10.0)
Chloride: 102 mmol/L (ref 98–107)
Creatinine: 1 mg/dL (ref 0.7–1.3)
GFR African American: 110 mL/min/{1.73_m2} (ref >=90)
GFR Non-African American: 95 mL/min/{1.73_m2} (ref >=90)
Globulin: 3 g/dL (ref 1.9–4.4)
Glucose: 124 mg/dL — ABNORMAL HIGH (ref 70–99)
OSMOLALITY CALCULATED: 279 mOsm/kg (ref 270–287)
Potassium: 4.3 mmol/L (ref 3.5–5.3)
Sodium: 139 mmol/L (ref 135–145)
Total Bilirubin: 0.55 mg/dL (ref 0.00–1.20)
Total Protein: 7.4 g/dL (ref 6.4–8.3)

## 2019-01-18 LAB — CBC WITH AUTO DIFFERENTIAL
Absolute Baso #: 0 10*3/uL (ref 0.0–0.2)
Absolute Eos #: 0.1 10*3/uL (ref 0.0–0.5)
Absolute Lymph #: 1.5 10*3/uL (ref 1.0–3.2)
Absolute Mono #: 0.6 10*3/uL (ref 0.3–1.0)
Basophils %: 0.5 % (ref 0.0–2.0)
Eosinophils %: 0.9 % (ref 0.0–7.0)
Hematocrit: 46.2 % (ref 38.0–52.0)
Hemoglobin: 15.8 g/dL (ref 13.0–17.3)
Immature Grans (Abs): 0.01 10*3/uL (ref 0.00–0.06)
Immature Granulocytes: 0.2 % (ref 0.1–0.6)
Lymphocytes: 26.4 % (ref 15.0–45.0)
MCH: 31.3 pg (ref 27.0–34.5)
MCHC: 34.2 g/dL (ref 32.0–36.0)
MCV: 91.7 fL (ref 84.0–100.0)
MPV: 10.8 fL (ref 7.2–13.2)
Monocytes: 9.5 % (ref 4.0–12.0)
Neutrophils %: 62.5 % (ref 42.0–74.0)
Neutrophils Absolute: 3.6 10*3/uL (ref 1.6–7.3)
Platelets: 182 10*3/uL (ref 140–440)
RBC: 5.04 x10e6/mcL (ref 4.00–5.60)
RDW: 11.9 % (ref 11.0–16.0)
WBC: 5.8 10*3/uL (ref 3.8–10.6)

## 2019-01-18 LAB — LIPASE: Lipase: 36 U/L (ref 13–60)

## 2019-01-18 LAB — TROPONIN T: Troponin T: <0.01 ng/mL (ref 0.000–0.010)

## 2019-01-18 NOTE — ED Notes (Signed)
ED Triage Note       ED Secondary Triage Entered On:  01/18/2019 8:28 EST    Performed On:  01/18/2019 8:25 EST by Ace, RN, Anderson Malta               General Information   Barriers to Learning :   None evident   ED Home Meds Section :   Document assessment   [facility] ED Fall Risk Section :   Document assessment   ED History Section :   Document assessment   ED Advance Directives Section :   Document assessment   ED Physician Specialty List Section :   Document assessment   ED Palliative Screen :   N/A (prefilled for <65yo)   Ace, RN, Anderson Malta - 01/18/2019 8:25 EST   (As Of: 01/18/2019 08:28:37 EST)   Problems(Active)    Diverticulitis (SNOMED CT  :952841324 )  Name of Problem:   Diverticulitis ; Recorder:   Ace, RN, Anderson Malta; Confirmation:   Confirmed ; Classification:   Patient Stated ; Code:   [id number] ; Contributor System:   Dietitian ; Last Updated:   01/18/2019 8:24 EST ; Life Cycle Date:   01/18/2019 ; Life Cycle Status:   Active ; Vocabulary:   SNOMED CT        Ex-smoker (SNOMED CT  :66440347 )  Name of Problem:   Ex-smoker ; Recorder:   Ace, RN, Anderson Malta; Confirmation:   Confirmed ; Classification:   Patient Stated ; Code:   [id number] ; Contributor System:   PowerChart ; Last Updated:   01/18/2019 8:25 EST ; Life Cycle Date:   01/18/2019 ; Life Cycle Status:   Active ; Vocabulary:   SNOMED CT        SVT (supraventricular tachycardia) (SNOMED CT  :75643329 )  Name of Problem:   SVT (supraventricular tachycardia) ; Recorder:   Ace, RN, Anderson Malta; Confirmation:   Confirmed ; Classification:   Patient Stated ; Code:   [id number] ; Contributor System:   PowerChart ; Last Updated:   01/18/2019 8:24 EST ; Life Cycle Date:   01/18/2019 ; Life Cycle Status:   Active ; Vocabulary:   SNOMED CT          Diagnoses(Active)    Chest pain  Date:   01/18/2019 ; Diagnosis Type:   Reason For Visit ; Confirmation:   Complaint of ; Clinical Dx:   Chest pain ; Classification:   Medical ; Clinical Service:   Emergency medicine ;  Code:   PNED ; Probability:   0 ; Diagnosis Code:   [id number]             -    Procedure History   (As Of: 01/18/2019 08:28:37 EST)     Anesthesia Minutes:   0 ; Procedure Name:   Hernia repair ; Procedure Minutes:   0 ; Last Reviewed Dt/Tm:   01/18/2019 08:27:53 EST            Anesthesia Minutes:   0 ; Procedure Name:   Tonsils and adenoids ; Procedure Minutes:   0 ; Last Reviewed Dt/Tm:   01/18/2019 08:28:34 EST            [facility] Fall Risk Assessment Tool   Hx of falling last 3 months ED Fall :   Yes (Single mechanical fall)   Patient confused or disoriented ED Fall :   No   Patient intoxicated or sedated ED Fall :   No  Patient impaired gait ED Fall :   No   Use a mobility assistance device ED Fall :   No   Patient altered elimination ED Fall :   No   [facility] ED Fall Score :   1    Ace, RN, Anderson Malta - 01/18/2019 8:25 EST   ED Advance Directive   Advance Directive :   No   Ace, RN, Anderson Malta - 01/18/2019 8:25 EST   Social History   Social History   (As Of: 01/18/2019 08:28:37 EST)   Tobacco:        Tobacco use: Former smoker, quit more than 30 days ago.   Comments:  01/18/2019 8:26 - Ace, RN, Anderson Malta: some  chewing   (Last Updated: 01/18/2019 08:26:43 EST by Donivan Scull, RN, Anderson Malta)          Alcohol:        Current, Beer, 3-5 times per week   (Last Updated: 01/18/2019 08:27:11 EST by Ace, RN, Anderson Malta)          Substance Use:        Denies   (Last Updated: 01/18/2019 08:27:16 EST by Ace, RN, Anderson Malta)            Physician Specialty List   Physician Specialty Provider :   Michae Kava, RN, Anderson Malta - 01/18/2019 8:25 EST   Med Hx   Medication List   (As Of: 01/18/2019 08:28:37 EST)   Home Meds    fluticasone nasal  :   fluticasone nasal ; Status:   Documented ; Ordered As Mnemonic:   Flonase ; Simple Display Line:   Daily, 0 Refill(s) ; Catalog Code:   fluticasone nasal ; Order Dt/Tm:   01/18/2019 08:25:59 EST          omeprazole  :   omeprazole ; Status:   Documented ;  Ordered As Mnemonic:   omeprazole ; Simple Display Line:   Oral, Daily, 0 Refill(s) ; Catalog Code:   omeprazole ; Order Dt/Tm:   01/18/2019 08:25:54 EST

## 2019-01-18 NOTE — ED Notes (Signed)
 ED Patient Summary       ;          [facility]  [address], [place], [place] [address]  [phone]  Discharge Instructions (Patient)  _______________________________________     Name:[name], [name]  DOB:  1980/07/19                   MRN: [id number]                   FIN: [phone]  Reason For Visit: Chest pain; CHEST TIGHTNESS  Final Diagnosis: Abnormal chest xray; Acute chest pain; GERD with esophagitis; Palpitations; Tight chest     Visit Date: 01/18/2019 08:15:00  Address: [address] [place] [address]  Phone: [phone]     Emergency Department Providers:         Primary Physician:   INDIA SIEVING [facility] would like to thank you for allowing us  to assist you with your healthcare needs. The following includes patient education materials and information regarding your injury/illness.     Follow-up Instructions:  You were seen today on an emergency basis. Please contact your primary care doctor for a follow up appointment. If you received a referral to a specialist doctor, it is important you follow-up as instructed.    It is important that you call your follow-up doctor to schedule and confirm the location of your next appointment. Your doctor may practice at multiple locations. The office location of your follow-up appointment may be different to the one written on your discharge instructions.    If you do not have a primary care doctor, please call [phone] for help in finding a [name]. [facility] Provider. For help in finding a specialist doctor, please call [phone].    The [facility] "Ask a Nurse" line in staffed by Registered Nurses and is a free service to the community. We are available Monday - Friday from 8am to 5pm to answer your questions about your health. Please call [phone].    If your condition gets worse before your follow-up with your primary care doctor or specialist,  please return to the Emergency Department.        Follow Up Appointments:  Primary Care Provider:      Name: Roberto LAURAINE [name]      Phone: [phone]                 With: Address: When:   [facility], [address], [address] [place], [place] [address]  [phone] Business (1) Within 1 week       With: Address: When:   Follow up with primary care provider  Within 1 week              Printed Prescriptions:    Patient Education Materials:  Discharge Orders          Discharge Patient 01/18/19 10:01:00 EST         Comment:      Nonspecific Chest Pain, Easy-to-Read; Esophagitis; Food Choices for Gastroesophageal Reflux Disease, Adult, Easy-to-Read; Gastroesophageal Reflux Disease, Adult, Easy-to-Read     Nonspecific Chest Pain    It is often hard to find the cause of chest pain. There is always a chance that your pain could be related to something serious, such as a heart attack or a  blood clot in your lungs. Chest pain can also be caused by conditions that are not life-threatening. If you have chest pain, it is very important to follow up with your doctor.          HOME CARE     If you were prescribed an antibiotic medicine, finish it all even if you start to feel better.     Avoid any activities that cause chest pain.     Do not use any tobacco products, including cigarettes, chewing tobacco, or electronic cigarettes. If you need help quitting, ask your doctor.     Do not drink alcohol.     Take medicines only as told by your doctor.     Keep all follow-up visits as told by your doctor. This is important. This includes any further testing if your chest pain does not go away.     Your doctor may tell you to keep your head raised (elevated) while you sleep.     Make lifestyle changes as told by your doctor. These may include:    ? Getting regular exercise. Ask your doctor to suggest some activities that are safe for you.    ? Eating a heart-healthy diet.  Your doctor or a diet specialist (dietitian) can help you to learn healthy eating options.    ? Maintaining a healthy weight.    ? Managing diabetes, if necessary.    ? Reducing stress.    GET HELP IF:     Your chest pain does not go away, even after treatment.     You have a rash with blisters on your chest.     You have a fever.    GET HELP RIGHT AWAY IF:     Your chest pain is worse.      You have an increasing cough, or you cough up blood.     You have severe belly (abdominal) pain.     You feel extremely weak.     You pass out (faint).     You have chills.     You have sudden, unexplained chest discomfort.     You have sudden, unexplained discomfort in your arms, back, neck, or jaw.     You have shortness of breath at any time.     You suddenly start to sweat, or your skin gets clammy.     You feel nauseous.     You vomit.     You suddenly feel light-headed or dizzy.     Your heart begins to beat quickly, or it feels like it is skipping beats.    These symptoms may be an emergency. Do not wait to see if the symptoms will go away. Get medical help right away. Call your local emergency services (911 in the [place] Do not drive yourself to the hospital.    This information is not intended to replace advice given to you by your health care provider. Make sure you discuss any questions you have with your health care provider.    Document Released: 08/07/2007 Document Revised: 03/11/2014 Document Reviewed: 09/24/2013  Elsevier Interactive Patient Education ?2016 Elsevier Inc.       Esophagitis    Esophagitis is inflammation of the esophagus. The esophagus is the tube that carries food and liquids from your mouth to your stomach. Esophagitis can cause soreness or pain in the esophagus. This condition can make it difficult and painful to swallow.       CAUSES  Most causes of esophagitis are not serious. Common causes of this condition include:     Gastroesophageal reflux disease (GERD). This is when stomach contents  move back up into the esophagus (reflux).     Repeated vomiting.     An allergic-type reaction, especially caused by food allergies (eosinophilic esophagitis).     Injury to the esophagus by swallowing large pills with or without water, or swallowing certain types of medicines.     Swallowing (ingesting) harmful chemicals, such as household cleaning products.     Heavy alcohol use.     An infection of the esophagus.?This most often occurs in people who have a weakened immune system.     Radiation or chemotherapy treatment for cancer.     Certain diseases such as sarcoidosis, Crohn disease, and scleroderma.    SYMPTOMS    Symptoms of this condition include:     Difficult or painful swallowing.     Pain with swallowing acidic liquids, such as citrus juices.     Pain with burping.     Chest pain.     Difficulty breathing.     Nausea.     Vomiting.     Pain in the abdomen.     Weight loss.     Ulcers in the mouth.     Patches of white material in the mouth (candidiasis).     Fever.     Coughing up blood or vomiting blood.     Stool that is black, tarry, or bright red.    DIAGNOSIS    Your health care provider will take a medical history and perform a physical exam. You may also have other tests, including:     An endoscopy to examine your stomach and esophagus with a small camera.     A test that measures the acidity level in your esophagus.     A test that measures how much pressure is on your esophagus.     A barium swallow or modified barium swallow to show the shape, size, and functioning of your esophagus.     Allergy tests.    TREATMENT    Treatment for this condition depends on the cause of your esophagitis. In some cases, steroids or other medicines may be given to help relieve your symptoms or to treat the underlying cause of your condition. You may have to make some lifestyle changes, such as:     Avoiding alcohol.     Quitting smoking.     Changing your diet.     Exercising.     Changing your sleep habits and  your sleep environment.    HOME CARE INSTRUCTIONS    Take these actions to decrease your discomfort and to help avoid complications.    Diet     Follow a diet as recommended by your health care provider. This may involve avoiding foods and drinks such as:    ? Coffee and tea (with or without caffeine).    ? Drinks that contain alcohol.    ? Energy drinks and sports drinks.    ? Carbonated drinks or sodas.    ? Chocolate and cocoa.    ? Peppermint and mint flavorings.    ? Garlic and onions.    ? Horseradish.    ? Spicy and acidic foods, including peppers, chili powder, curry powder, vinegar, hot sauces, and barbecue sauce.    ? Citrus fruit juices and citrus fruits, such as oranges, lemons, and limes.    ?  Tomato-based foods, such as red sauce, chili, salsa, and pizza with red sauce.    ? Fried and fatty foods, such as donuts, french fries, potato chips, and high-fat dressings.    ? High-fat meats, such as hot dogs and fatty cuts of red and white meats, such as rib eye steak, sausage, ham, and bacon.    ? High-fat dairy items, such as whole milk, butter, and cream cheese.     Eat small, frequent meals instead of large meals.     Avoid drinking large amounts of liquid with your meals.     Avoid eating meals during the 2?3 hours before bedtime.     Avoid lying down right after you eat.     Do not exercise right after you eat.     Avoid foods and drinks that seem to make your symptoms worse.    General Instructions     Pay attention to any changes in your symptoms.     Take over-the-counter and prescription medicines only as told by your health care provider. Do not take aspirin, ibuprofen, or other NSAIDs unless your health care provider told you to do so.     If you have trouble taking pills, use a pill splitter to decrease the size of the pill. This will decrease the chance of the pill getting stuck or injuring your esophagus on the way down. Also, drink water after you take a pill.     Do not use any tobacco  products, including cigarettes, chewing tobacco, and e-cigarettes. If you need help quitting, ask your health care provider.     Wear loose-fitting clothing. Do not wear anything tight around your waist that causes pressure on your abdomen.     Raise (elevate) the head of your bed about 6 inches (15 cm).     Try to reduce your stress, such as with yoga or meditation. If you need help reducing stress, ask your health care provider.     If you are overweight, reduce your weight to an amount that is healthy for you. Ask your health care provider for guidance about a safe weight loss goal.     Keep all follow-up visits as told by your health care provider. This is important.    SEEK MEDICAL CARE IF:     You have new symptoms.     You have unexplained weight loss.     You have difficulty swallowing, or it hurts to swallow.     You have wheezing or a persistent cough.     Your symptoms do not improve with treatment.     You have frequent heartburn for more than two weeks.    SEEK IMMEDIATE MEDICAL CARE IF:     You have severe pain in your arms, neck, jaw, teeth, or back.     You feel sweaty, dizzy, or light-headed.     You have chest pain or shortness of breath.     You vomit and your vomit looks like blood or coffee grounds.     Your stool is bloody or black.     You have a fever.     You cannot swallow, drink, or eat.    This information is not intended to replace advice given to you by your health care provider. Make sure you discuss any questions you have with your health care provider.    Document Released: 03/28/2004 Document Revised: 11/09/2014 Document Reviewed: 06/15/2014  Elsevier Interactive Patient Education ?2016 Elsevier Inc.  Food Choices for Gastroesophageal Reflux Disease, Adult    When you have gastroesophageal reflux disease (GERD), the foods you eat and your eating habits are very important. Choosing the right foods can help ease your discomfort.     WHAT GUIDELINES DO I NEED TO FOLLOW?     Choose  fruits, vegetables, whole grains, and low-fat dairy products. ?     Choose low-fat meat, fish, and poultry.     Limit fats such as oils, salad dressings, butter, nuts, and avocado.   ?     Keep a food diary. This helps you identify foods that cause symptoms. ?     Avoid foods that cause symptoms. These may be different for everyone. ?     Eat small meals often instead of 3 large meals a day. ?     Eat your meals slowly, in a place where you are relaxed. ?     Limit fried foods. ?     Cook foods using methods other than frying. ?     Avoid drinking alcohol. ?     Avoid drinking large amounts of liquids with your meals. ?     Avoid bending over or lying down until 2?3 hours after eating. ?    WHAT FOODS ARE NOT RECOMMENDED?    These are some foods and drinks that may make your symptoms worse:    Vegetables    Tomatoes. Tomato juice. Tomato and spaghetti sauce. Chili peppers. Onion and garlic. Horseradish.    Fruits    Oranges, grapefruit, and lemon (fruit and juice).    Meats    High-fat meats, fish, and poultry. This includes hot dogs, ribs, ham, sausage, salami, and bacon.    Dairy    Whole milk and chocolate milk. Sour cream. Cream. Butter. Ice cream. Cream cheese.     Drinks    Coffee and tea. Bubbly (carbonated) drinks or energy drinks.    Condiments    Hot sauce. Barbecue sauce.     Sweets/Desserts    Chocolate and cocoa. Donuts. Peppermint and spearmint.    Fats and Oils    High-fat foods. This includes [place] fries and potato chips.    Other    Vinegar. Strong spices. This includes black pepper, white pepper, red pepper, cayenne, curry powder, cloves, ginger, and chili powder.    The items listed above may not be a complete list of foods and drinks to avoid. Contact your dietitian for more information.    This information is not intended to replace advice given to you by your health care provider. Make sure you discuss any questions you have with your health care provider.    Document Released: 08/20/2011  Document Revised: 03/11/2014 Document Reviewed: 12/23/2012  Elsevier Interactive Patient Education ?2016 Elsevier Inc.       Gastroesophageal Reflux Disease, Adult    Normally, food travels down the esophagus and stays in the stomach to be digested. If a person has gastroesophageal reflux disease (GERD), food and stomach acid move back up into the esophagus. When this happens, the esophagus becomes sore and swollen (inflamed). Over time, GERD can make small holes (ulcers) in the lining of the esophagus.      HOME CARE    Diet?     Follow a diet as told by your doctor. You may need to avoid foods and drinks such as:    ? Coffee and tea (with or without caffeine).    ? Drinks that contain alcohol.    ?  Energy drinks and sports drinks.    ? Carbonated drinks or sodas.    ? Chocolate and cocoa.    ? Peppermint and mint flavorings.    ? Garlic and onions.    ? Horseradish.    ? Spicy and acidic foods, such as peppers, chili powder, curry powder, vinegar, hot sauces, and BBQ sauce.    ? Citrus fruit juices and citrus fruits, such as oranges, lemons, and limes.    ? Tomato-based foods, such as red sauce, chili, salsa, and pizza with red sauce.    ? Fried and fatty foods, such as donuts, french fries, potato chips, and high-fat dressings.    ? High-fat meats, such as hot dogs, rib eye steak, sausage, ham, and bacon.    ? High-fat dairy items, such as whole milk, butter, and cream cheese.     Eat small meals often. Avoid eating large meals.     Avoid drinking large amounts of liquid with your meals.     Avoid eating meals during the 2?3 hours before bedtime.     Avoid lying down right after you eat.     Do not exercise right after you eat.    ?General Instructions?     Pay attention to any changes in your symptoms.     Take over-the-counter and prescription medicines only as told by your doctor. Do not take aspirin, ibuprofen, or other NSAIDs unless your doctor says it is okay.     Do not use any tobacco products, including  cigarettes, chewing tobacco, and e-cigarettes. If you need help quitting, ask your doctor.     Wear loose clothes. Do not wear anything tight around your waist.     Raise (elevate) the head of your bed about 6 inches (15 cm).     Try to lower your stress. If you need help doing this, ask your doctor.     If you are overweight, lose an amount of weight that is healthy for you. Ask your doctor about a safe weight loss goal.     Keep all follow-up visits as told by your doctor. This is important.    GET HELP IF:     You have new symptoms.     You lose weight and you do not know why it is happening.     You have trouble swallowing, or it hurts to swallow.     You have wheezing or a cough that keeps happening.     Your symptoms do not get better with treatment.     You have a hoarse voice.    GET HELP RIGHT AWAY IF:     You have pain in your arms, neck, jaw, teeth, or back.     You feel sweaty, dizzy, or light-headed.     You have chest pain or shortness of breath.     You throw up (vomit) and your throw up looks like blood or coffee grounds.     You pass out (faint).     Your poop (stool) is bloody or black.     You cannot swallow, drink, or eat.    This information is not intended to replace advice given to you by your health care provider. Make sure you discuss any questions you have with your health care provider.    Document Released: 08/07/2007 Document Revised: 11/09/2014 Document Reviewed: 06/15/2014  Elsevier Interactive Patient Education ?2016 Elsevier Inc.         Allergy Info: sulfa  drugs     Medication Information:  Roberto Deitra Leech [facility] ED Physicians provided you with a complete list of medications post discharge, if you have been instructed to stop taking a medication please ensure you also follow up with this information to your Primary Care Physician.  Unless otherwise noted, patient will continue to take medications as prescribed prior to the Emergency Room visit.  Any specific  questions regarding your chronic medications and dosages should be discussed with your physician(s) and pharmacist.          Al hydroxide/Mg hydroxide/simethicone (Maalox oral suspension) 30 Milliliter Oral (given by mouth) four times a day (before meals and at bedtime) for 7 Days. Refills: 0.  diazepam (Valium 5 mg oral tablet) 1 Tabs Oral (given by mouth) 4 times a day as needed spasm for 3 Days. Refills: 0.  fluticasone nasal (Flonase) every day.  omeprazole  Oral (given by mouth) every day.      Medications Administered During Visit:              Medication Dose Route   lidocaine topical 15 mL Oral   Al hydroxide/Mg hydroxide/simethicone 30 mL Oral   famotidine 20 mg Oral   iopamidol  100 mL IV Contrast   diazepam 5 mg Oral          Major Tests and Procedures:  The following procedures and tests were performed during your ED visit.  PROCEDURES%>  PROCEDURES COMMENTS%>          Laboratory Orders  Name Status Details   CBCDIFF Completed Blood, Stat, ST - Stat, 01/18/19 8:27:00 EST, 01/18/19 8:27:00 EST, Nurse collect, LEWIS-MD,  DANIEL ETHAN, Print label Y/N   CMP Completed Blood, Stat, ST - Stat, 01/18/19 8:27:00 EST, 01/18/19 8:27:00 EST, Nurse collect, LEWIS-MD,  DANIEL ETHAN, Print label Y/N   Lipase Lvl Completed Blood, Stat, ST - Stat, 01/18/19 8:27:00 EST, 01/18/19 8:27:00 EST, Nurse collect, LEWIS-MD,  DANIEL ETHAN, Print label Y/N   Trop T Completed Blood, Stat, ST - Stat, 01/18/19 8:27:00 EST, 01/18/19 8:27:00 EST, Nurse collect, LEWIS-MD,  TORIBIO DEL, Print label Y/N   XTube Blue Completed Blood, Stat, ST - Stat, Collected, 01/18/19 8:29:00 EST [id number], 01/18/19 8:29:00 EST, Nurse collect, Venous Draw, 01/18/19 8:33:00 EST, BH CP Login, LEWIS-MD,  DANIEL ETHAN, Print label Y/N, [email or web address]_[email or web address], bh1_lab_drw1_lbl1, 1.8 mL Aolz/*[id number]*/,...   XTube PST Completed Blood, Stat, ST - Stat, Collected, 01/18/19 8:29:00 EST [id number], 01/18/19 8:29:00 EST, Nurse collect, Venous Draw, 01/18/19 8:33:00 EST, BH CP  Login, LEWIS-MD,  DANIEL ETHAN, Print label Y/N, bh1_spec_[email or web address], bh1_lab_drw1_lbl1, 4 mL SST PST/*B*/, Complete   XTube SST Completed Blood, Stat, ST - Stat, Collected, 01/18/19 8:29:00 EST [id number], 01/18/19 8:29:00 EST, Nurse collect, Venous Draw, 01/18/19 8:33:00 EST, BH CP Login, LEWIS-MD,  DANIEL ETHAN, Print label Y/N, bh1_spec_lbl1, bh1_lab_drw1_lbl1, 4 mL DDU/*[id number]*/, Co...               Radiology Orders  Name Status Details   CT Angiography Chest w/ + w/o Contrast Completed 01/18/19 9:10:00 EST, STAT 1 hour or less, Reason: PE suspected, high pretest prob, Transport Mode: STRETCHER, Rad Type, pp_set_radiology_subspecialty   XR Chest 1 View Portable Completed 01/18/19 8:27:00 EST, STAT 1 hour or less, Reason: Chest pain, Transport Mode: STRETCHER, pp_set_radiology_subspecialty               Patient Care Orders  Name Status Details   Discharge Patient Ordered 01/18/19 10:01:00 EST   ED Assessment Adult Completed 01/18/19 8:25:25 EST, 01/18/19 8:25:25 EST  ED Secondary Triage Completed 01/18/19 8:25:25 EST, 01/18/19 8:25:25 EST   ED Triage Adult Completed 01/18/19 8:15:48 EST, 01/18/19 8:15:48 EST   Patient Specific Fall Safety Measures Completed 01/18/19 8:28:37 EST, Once, 01/18/19 8:28:37 EST, ED Low Fall Risk Documentation       ---------------------------------------------------------------------------------------------------------------------  [facility]to Shel[facility]) encourages you to self-enroll in the [facility] Patient Portal.  [facility] Patient Portal will allow you to manage your personal health information securely from your own electronic device now and in the future.  To begin your Patient Portal enrollment process, please visit [email or web address] Click on "Sign up now" under [place].  If you find that you need additional assistance on the [facility] Patient Portal or need a copy of your medical records, please call the [facility] Medical Records  Office at [phone].  Comment:

## 2019-01-18 NOTE — ED Provider Notes (Signed)
Chest pain        Patient:   [name], [name]            MRN: [id number]            FIN: [id number]               Age:   38 years     Sex:  Male     DOB:  [birth year]   Associated Diagnoses:   Acute chest pain; Tight chest; GERD with esophagitis; Palpitations; Abnormal chest xray   Author:   Skip Mayer      Basic Information   Additional information: Chief Complaint from Nursing Triage Note   Chief Complaint  Chief Complaint: substernal chest tightness and radiates to back between shoulder blades, intermittent but just in intensity , nothing makes it better orworse, HX of SVT, started last night 1030pm, woke him up out  of sleep, no cough or fever (01/18/19 08:19:00).      History of Present Illness   The patient presents with Patient has a chief complaint of chest tightness radiates to his back between her shoulder blades that started last evening approximately 10 hours ago he states that it has been constant since then nothing seems to make it better or worse woke him up from sleep, is a history of SVT and had some palpitations but did not feel that he was in SVT last evening, he has not had similar tightness in the chest prior moderate intensity nothing seems to make it better or worse, denies any other significant pertinent medical history no family history of premature coronary disease, no significant shortness of breath nausea vomiting diaphoresis.  He does have a history of significant reflux disease and is on high-dose omeprazole and has been for years, he states that he has been heavily drinking over the last several weeks and is attempted to cut back however on Saturday this last weekend he had a very heavy drinking evening and then the day after he felt poorly.  He is unsure if this is related..        Review of Systems   Constitutional symptoms:  No fever, no chills.    Respiratory symptoms:  No shortness of breath, no hemoptysis, no wheezing.    Cardiovascular symptoms:  Chest pain.    Gastrointestinal symptoms:  No abdominal pain,    Musculoskeletal symptoms:  No back pain,    Neurologic symptoms:  No headache,              Additional review of systems information: All other systems reviewed and otherwise negative.      Health Status   Allergies:    Allergic Reactions (Selected)  Moderate  Sulfa drugs- Rash..   Medications:  (Selected)   Inpatient Medications  Ordered  Isovue-370: 100 mL, IV Contrast, Once  Pepcid: 20 mg, 1 tabs, Oral, Once  aluminum hydroxide/magnesium hydroxide/simethicone 200 mg-200 mg-20 mg/5 mL oral suspension: 30 mL, Oral, Once  lidocaine 2% mucous membrane solution: 15 mL, Oral, Once  Documented Medications  Documented  Flonase: Daily, 0 Refill(s)  omeprazole: Oral, Daily, 0 Refill(s).      Past Medical/ Family/ Social History   Medical history: Reviewed as documented in chart.   Surgical history: Reviewed as documented in chart.   Family history: Not significant.   Social history:    Social & Psychosocial Habits    Alcohol  01/18/2019  Use: Current    Type: [employer]  Frequency: 3-5 times per week    Substance Use  01/18/2019  Use: Denies    Tobacco  01/18/2019  Use: Former smoker, quit more    Comment: some  chewing - 01/18/2019 08:26 - Ace, RN, Maricela Bo  , Reviewed as documented in chart.   Problem list:    Active Problems (3)  Diverticulitis   Ex-smoker   SVT (supraventricular tachycardia)   .      Physical Examination               Vital Signs   Vital Signs   91/47/8295 6:21 EST Systolic Blood Pressure 308 mmHg  HI    Diastolic Blood Pressure 97 mmHg  HI    Temperature Oral 36.9 degC    Heart Rate Monitored 86 bpm    Respiratory Rate 16 br/min    SpO2 99 %   .   Measurements   01/18/2019 8:25 EST Body Mass Index est meas 32.98 kg/m2   01/18/2019 8:25 EST Body Mass Index Measured 32.98 kg/m2   01/18/2019 8:19 EST Height/Length Measured 178 cm    Weight Dosing 104.5 kg   .   Basic Oxygen Information   01/18/2019 8:19 EST SpO2 99 %    Oxygen Therapy Room air   .    General:  Alert, no acute distress.    Skin:  Warm, dry, pink.    Head:  Normocephalic.   Neck:  Supple, trachea midline.    Eye:  Pupils are equal, round and reactive to light, extraocular movements are intact.    Ears, nose, mouth and throat:  Oral mucosa moist.   Cardiovascular:  Regular rate and rhythm.   Respiratory:  Lungs are clear to auscultation, respirations are non-labored.    Back:  Normal range of motion.   Musculoskeletal:  Normal ROM, no tenderness.    Chest wall   Gastrointestinal:  Soft, Nontender, Non distended, Normal bowel sounds, No organomegaly.    Neurological:  Alert and oriented to person, place, time, and situation, No focal neurological deficit observed, normal sensory observed, normal motor observed.    Lymphatics:  No lymphadenopathy.   Psychiatric:  Cooperative, appropriate mood & affect.       Medical Decision Making   Documents reviewed:  Emergency department nurses' notes.   Electrocardiogram:  Normal sinus rhythm, No ST-T changes, no ectopy, normal PR & QRS intervals, Clinical impression: Sinus rhythm without evidence of acute ischemia. Visualized and interpreted by me.    Results review:  Lab results : Lab View   01/18/2019 8:51 EST Estimated Creatinine Clearance 121.42 mL/min   01/18/2019 8:29 EST WBC 5.8 x10e3/mcL    RBC 5.04 x10e6/mcL    Hgb 15.8 g/dL    HCT 46.2 %    MCV 91.7 fL    MCH 31.3 pg    MCHC 34.2 g/dL    RDW 11.9 %    Platelet 182 x10e3/mcL    MPV 10.8 fL    Neutro Auto 62.5 %    Neutro Absolute 3.6 x10e3/mcL    Immature Grans Percent 0.2 %    Immature Grans Absolute 0.01 x10e3/mcL    Lymph Auto 26.4 %    Lymph Absolute 1.5 x10e3/mcL    Mono Auto 9.5 %    Mono Absolute 0.6 x10e3/mcL    Eosinophil Percent 0.9 %    Eos Absolute 0.1 x10e3/mcL    Basophil Auto 0.5 %    Baso Absolute 0.0 x10e3/mcL    Sodium Lvl 139 mmol/L  Potassium Lvl 4.3 mmol/L    Chloride 102 mmol/L    CO2 25 mmol/L    Glucose Random 124 mg/dL  HI    BUN 14 mg/dL    Creatinine Lvl 1.0 mg/dL    AGAP  13 mmol/L    Osmolality Calc 279 mOsm/kg    Calcium Lvl 9.9 mg/dL    Protein Total 7.4 g/dL    Albumin Lvl 4.8 g/dL    Globulin Calc 3.0 g/dL    AG Ratio Calc 2.00 mmol/L    Alk Phos 94 unit/L    AST 24 unit/L    ALT 45 unit/L  HI    eGFR AA 110 mL/min/1.22m???    eGFR Non-AA 95 mL/min/1.749m??    Bili Total 0.55 mg/dL    Lipase Lvl 36 unit/L    Trop T Quant <0.010 ng/mL   01/18/2019 8:25 EST Estimated Creatinine Clearance 134.91 mL/min   , Pulse Oximetry is greater than 94% on room air which is acceptable per my interpretation.    Radiology results:  Rad Results (ST)   XR Chest 1 View Portable  ?  01/18/19 08:56:28  CHEST: AP (or PA), 01/18/2019 8:36 AM    INDICATION: Chest pain    COMPARISON: None    FINDINGS: Exam is obtained in relatively poor inspiration. Cardiac silhouette is  not enlarged. No pulmonary vascular congestion or pulmonary edema is  appreciated. No pneumonia. No pleural fluid.    Over the left apex a rounded 12 mm density is noted.      IMPRESSION:    No acute process identified.    12 mm rounded density over the left apex which could be a pulmonary nodule or an  overlying density such as anterior first rib. PA and lordotic views may be  useful, or CT could be obtained.  ?  Signed By: MEBrandon Melnick    Reexamination/ Reevaluation   Vital signs   Basic Oxygen Information   01/18/2019 8:19 EST SpO2 99 %    Oxygen Therapy Room air      Notes: Patient given GI cocktail and Pepcid, discussed and disclosed chest x-ray findings the patient and we have discussed the risk benefits alternatives of obtaining PA and lateral chest x-ray or CT scan both agree that CT scan will give usKoreadditional information plus rule out other life threats versus additional x-ray imaging.  Patient is still having chest discomfort to his back.      Impression and Plan   Diagnosis   Acute chest pain (ICD10-CM R07.9, Discharge, Medical)   Acute chest pain (ICD10-CM R07.9, Discharge, Medical)   Tight chest (ICD10-CM R07.89,  Discharge, Medical)   GERD with esophagitis (ICD10-CM K21.0, Discharge, Medical)   Palpitations (ICD10-CM R00.2, Discharge, Medical)   Abnormal chest xray (ICD10-CM R93.89, Discharge, Medical)   Plan   Condition: Stable.    Disposition: Discharged: Time  01/18/2019 09:58:00, to home.    Prescriptions: [facility] prescriptions   Pharmacy:  Valium 5 mg oral tablet (Prescribe): 5 mg, 1 tabs, Oral, QID, for 3 days, PRN: spasm, 12 tabs, 0 Refill(s)  Maalox oral suspension (Prescribe): 30 mL, Oral, QIDACHS, for 7 days, 840 mL, 0 Refill(s).    Patient was given the following educational materials: Gastroesophageal Reflux Disease, Adult, Easy-to-Read, Food Choices for Gastroesophageal Reflux Disease, Adult, Easy-to-Read, Esophagitis, Nonspecific Chest Pain, Easy-to-Read, Nonspecific Chest Pain, Easy-to-Read, Esophagitis, Food Choices for Gastroesophageal Reflux Disease, Adult, Easy-to-Read, Gastroesophageal Reflux Disease, Adult, Easy-to-Read.    Follow up  with: Follow up with primary care provider Within 1 week; [place] Within 1 week, [place] Within 1 week; Follow up with primary care provider Within 1 week.    Counseled: I had a detailed discussion with the patient and/or guardian regarding the historical points/exam findings supporting the discharge diagnosis and need for outpatient followup. Discussed the need to return to the ER if symptoms persist/worsen, or for any questions/concerns that arise at home.    Signature Line     Electronically Signed on 01/18/2019 10:00 AM EST   ________________________________________________   Skip Mayer               Modified by: Skip Mayer on 01/18/2019 10:00 AM EST

## 2019-01-18 NOTE — ED Notes (Signed)
ED Triage Note       ED Triage Adult Entered On:  01/18/2019 8:25 EST    Performed On:  01/18/2019 8:19 EST by Ace, RN, Anderson Malta               Triage   Chief Complaint :   substernal chest tightness and radiates to back between shoulder blades, intermittent but just in intensity , nothing makes it better orworse, HX of SVT, started last night 1030pm, woke him up out  of sleep, no cough or fever    Numeric Rating Pain Scale :   4   [place] Mode of Arrival :   Private vehicle   Infectious Disease Documentation :   Document assessment   Temperature Oral :   36.9 degC(Converted to: 98.4 degF)    Heart Rate Monitored :   86 bpm   Respiratory Rate :   16 br/min   Systolic Blood Pressure :   170 mmHg (HI)    Diastolic Blood Pressure :   97 mmHg (HI)    SpO2 :   99 %   Oxygen Therapy :   Room air   Patient presentation :   None of the above   Chief Complaint or Presentation suggest infection :   Yes   Weight Dosing :   104.5 kg(Converted to: 230 lb 6 oz)    Height :   178 cm(Converted to: 5 ft 10 in)    Body Mass Index Dosing :   33 kg/m2   Ace, RN, Anderson Malta - 01/18/2019 8:19 EST   DCP GENERIC CODE   Tracking Acuity :   3   Tracking Group :   ED [employer], RN, Anderson Malta - 01/18/2019 8:19 EST   ED General Section :   Document assessment   Pregnancy Status :   N/A   ED Allergies Section :   Document assessment   ED Reason for Visit Section :   Document assessment   ED Quick Assessment :   Patient appears awake, alert, oriented to baseline. Skin warm and dry. Moves all extremities. Respiration even and unlabored. Appears in no apparent distress.   Ace, RN, Anderson Malta - 01/18/2019 8:19 EST   ID Risk Screen Symptoms   Recent Travel History :   No recent travel   Close Contact with COVID-19 ID :   No   Last 14 days COVID-19 ID :   No   TB Symptom Screen :   No symptoms   C. diff Symptom/History ID :   Neither of the above   Ace, RN, Anderson Malta - 01/18/2019 8:19 EST   Allergies   (As Of: 01/18/2019 08:25:25  EST)   Allergies (Active)   sulfa drugs  Estimated Onset Date:   Unspecified ; Reactions:   rash ; Created By:   Donivan Scull RN, Anderson Malta; Reaction Status:   Active ; Category:   Drug ; Substance:   sulfa drugs ; Type:   Allergy ; Severity:   Moderate ; Updated By:   Donivan Scull RN, Anderson Malta; Reviewed Date:   01/18/2019 8:23 EST        Psycho-Social   Last 3 mo, thoughts killing self/others :   Patient denies   Feels Unsafe at Home :   No   ED Behavioral Activity Rating Scale :   4 - Quiet and awake (normal level of activity)   Ace, RN, Anderson Malta - 01/18/2019 8:19  EST   ED Reason for Visit   (As Of: 01/18/2019 08:25:25 EST)   Problems(Active)    Diverticulitis (SNOMED CT  :323557322 )  Name of Problem:   Diverticulitis ; Recorder:   Ace, RN, Maricela Bo; Confirmation:   Confirmed ; Classification:   Patient Stated ; Code:   [id number] ; Contributor System:   Conservation officer, nature ; Last Updated:   01/18/2019 8:24 EST ; Life Cycle Date:   01/18/2019 ; Life Cycle Status:   Active ; Vocabulary:   SNOMED CT        Ex-smoker (SNOMED CT  :37628315 )  Name of Problem:   Ex-smoker ; Recorder:   Ace, RN, Maricela Bo; Confirmation:   Confirmed ; Classification:   Patient Stated ; Code:   [id number] ; Contributor System:   PowerChart ; Last Updated:   01/18/2019 8:25 EST ; Life Cycle Date:   01/18/2019 ; Life Cycle Status:   Active ; Vocabulary:   SNOMED CT        SVT (supraventricular tachycardia) (SNOMED CT  :71062694 )  Name of Problem:   SVT (supraventricular tachycardia) ; Recorder:   Ace, RN, Maricela Bo; Confirmation:   Confirmed ; Classification:   Patient Stated ; Code:   [id number] ; Contributor System:   PowerChart ; Last Updated:   01/18/2019 8:24 EST ; Life Cycle Date:   01/18/2019 ; Life Cycle Status:   Active ; Vocabulary:   SNOMED CT          Diagnoses(Active)    Chest pain  Date:   01/18/2019 ; Diagnosis Type:   Reason For Visit ; Confirmation:   Complaint of ; Clinical Dx:   Chest pain ; Classification:   Medical ; Clinical Service:    Emergency medicine ; Code:   PNED ; Probability:   0 ; Diagnosis Code:   [id number]

## 2019-01-18 NOTE — Discharge Summary (Signed)
 ED Clinical Summary                         [facility]  [address]  [place], [place] [address]  [phone]           PERSON INFORMATION  Name: [name], [name] Age:  38 Years DOB: [birth year]   Sex: Male Language: English PCP: [name]   Marital Status:  Single Phone: [phone] Med Service: MED-Medicine   MRN:  [id number] Acct# [id number] Arrival: 01/18/2019 08:15:00   Visit Reason: Chest pain; CHEST TIGHTNESS Acuity: 3 LOS: 000 02:00   Address:      [address] [place] [address]  Diagnosis:      Abnormal chest xray; Acute chest pain; GERD with esophagitis; Palpitations; Tight chest  Printed Prescriptions:            Allergies      sulfa drugs (rash)      Medications Administered During Visit:                  Medication Dose Route   lidocaine topical 15 mL Oral   Al hydroxide/Mg hydroxide/simethicone 30 mL Oral   famotidine 20 mg Oral   iopamidol  100 mL IV Contrast   diazepam 5 mg Oral       Patient Medication List:              Al hydroxide/Mg hydroxide/simethicone (Maalox oral suspension) 30 Milliliter Oral (given by mouth) four times a day (before meals and at bedtime) for 7 Days. Refills: 0.  diazepam (Valium 5 mg oral tablet) 1 Tabs Oral (given by mouth) 4 times a day as needed spasm for 3 Days. Refills: 0.  fluticasone nasal (Flonase) every day.  omeprazole  Oral (given by mouth) every day.         Major Tests and Procedures:  The following procedures and tests were performed during your ED visit.  COMMONPROCEDURES%>  COMMON PROCEDURESCOMMENTS%>          Laboratory Orders  Name Status Details   CBCDIFF Completed Blood, Stat, ST - Stat, 01/18/19 8:27:00 EST, 01/18/19 8:27:00 EST, Nurse collect, LEWIS-MD,  DANIEL ETHAN, Print label Y/N   CMP Completed Blood, Stat, ST - Stat, 01/18/19 8:27:00 EST, 01/18/19 8:27:00 EST, Nurse collect, LEWIS-MD,  DANIEL ETHAN, Print label Y/N   Lipase Lvl Completed Blood, Stat, ST - Stat, 01/18/19 8:27:00 EST,  01/18/19 8:27:00 EST, Nurse collect, LEWIS-MD,  DANIEL ETHAN, Print label Y/N   Trop T Completed Blood, Stat, ST - Stat, 01/18/19 8:27:00 EST, 01/18/19 8:27:00 EST, Nurse collect, LEWIS-MD,  TORIBIO DEL, Print label Y/N   XTube Blue Completed Blood, Stat, ST - Stat, Collected, 01/18/19 8:29:00 EST [id number], 01/18/19 8:29:00 EST, Nurse collect, Venous Draw, 01/18/19 8:33:00 EST, BH CP Login, LEWIS-MD,  DANIEL ETHAN, Print label Y/N, bh1_spec_lbl1, bh1_lab_drw1_lbl1, 1.8 mL Aolz/*[id number]*/,...   XTube PST Completed Blood, Stat, ST - Stat, Collected, 01/18/19 8:29:00 EST [id number], 01/18/19 8:29:00 EST, Nurse collect, Venous Draw, 01/18/19 8:33:00 EST, BH CP Login, LEWIS-MD,  DANIEL ETHAN, Print label Y/N, bh1_spec_lbl1, bh1_lab_drw1_lbl1, 4 mL SST PST/*B*/, Complete   XTube SST Completed Blood, Stat, ST - Stat, Collected, 01/18/19 8:29:00 EST [id number], 01/18/19 8:29:00 EST, Nurse collect, Venous Draw, 01/18/19 8:33:00 EST, BH CP Login, LEWIS-MD,  DANIEL ETHAN, Print label Y/N, bh1_spec_lbl1, bh1_lab_drw1_lbl1, 4 mL DDU/*[id number]*/, Co..SABRA  Radiology Orders  Name Status Details   CT Angiography Chest w/ + w/o Contrast Completed 01/18/19 9:10:00 EST, STAT 1 hour or less, Reason: PE suspected, high pretest prob, Transport Mode: STRETCHER, Rad Type, pp_set_radiology_subspecialty   XR Chest 1 View Portable Completed 01/18/19 8:27:00 EST, STAT 1 hour or less, Reason: Chest pain, Transport Mode: STRETCHER, pp_set_radiology_subspecialty               Patient Care Orders  Name Status Details   Discharge Patient Ordered 01/18/19 10:01:00 EST   ED Assessment Adult Completed 01/18/19 8:25:25 EST, 01/18/19 8:25:25 EST   ED Secondary Triage Completed 01/18/19 8:25:25 EST, 01/18/19 8:25:25 EST   ED Triage Adult Completed 01/18/19 8:15:48 EST, 01/18/19 8:15:48 EST   Patient Specific Fall Safety Measures Completed 01/18/19 8:28:37 EST, Once, 01/18/19 8:28:37 EST, ED Low Fall Risk Documentation             PROVIDER  INFORMATION               Provider Role Assigned Sampson INDIA SIEVING [facility] ED Provider 01/18/2019 08:18:10    Lender, RN, Ginny ED Nurse 01/18/2019 09:11:15        Attending Physician:  INDIA SIEVING DEL     Admit Doc  LEWIS-MD,  SIEVING DEL     Consulting Doc       VITALS INFORMATION  Vital Sign Triage Latest   Temp Oral ORAL_1%>36.9 degC ORAL%>36.8 degC   Temp Temporal TEMPORAL_1%> TEMPORAL%>   Temp Intravascular INTRAVASCULAR_1%> INTRAVASCULAR%>   Temp Axillary AXILLARY_1%> AXILLARY%>   Temp Rectal RECTAL_1%> RECTAL%>   02 Sat 99 % 96 %   Respiratory Rate RATE_1%>16 br/min RATE%>14 br/min   Peripheral Pulse Rate PULSE RATE_1%>69 bpm PULSE RATE%>67 bpm   Apical Heart Rate HEART RATE_1%> HEART RATE%>   Blood Pressure BLOOD PRESSURE_1%>/ BLOOD PRESSURE_1%>97 mmHg BLOOD PRESSURE%>133 mmHg / BLOOD PRESSURE%>84 mmHg                 Immunizations      No Immunizations Documented This Visit          DISCHARGE INFORMATION   Discharge Disposition: H Outpt-Sent Home   Discharge Location:    Home   Discharge Date and Time:    01/18/2019 10:15:41   ED Checkout Date and Time:    01/18/2019 10:15:41     DEPART REASON INCOMPLETE INFORMATION               Depart Action Incomplete Reason   Interactive View/I&O Recently assessed               Problems      No Problems Documented              Smoking Status      Former smoker, quit more than 30 days ago         PATIENT EDUCATION INFORMATION  Instructions:       Nonspecific Chest Pain, Easy-to-Read; Esophagitis; Food Choices for Gastroesophageal Reflux Disease, Adult, Easy-to-Read; Gastroesophageal Reflux Disease, Adult, Easy-to-Read     Follow up:                    With: Address: When:   [facility] [employer], [address], [address] [place], [place] [address]  [phone] Business (1) Within 1 week       With: Address: When:   Follow up with primary care provider  Within 1 week  ED PROVIDER DOCUMENTATION      Patient:   [name]            MRN: [id number]            FIN: [id number]               Age:   38 years     Sex:  Male     DOB:  Jul 04, 1980   Associated Diagnoses:   Acute chest pain; Tight chest; GERD with esophagitis; Palpitations; Abnormal chest xray   Author:   INDIA TORIBIO DEL      Basic Information   Additional information: Chief Complaint from Nursing Triage Note   Chief Complaint  Chief Complaint: substernal chest tightness and radiates to back between shoulder blades, intermittent but just in intensity , nothing makes it better orworse, HX of SVT, started last night 1030pm, woke him up out  of sleep, no cough or fever (01/18/19 08:19:00).      History of Present Illness   The patient presents with Patient has a chief complaint of chest tightness radiates to his back between her shoulder blades that started last evening approximately 10 hours ago he states that it has been constant since then nothing seems to make it better or worse woke him up from sleep, is a history of SVT and had some palpitations but did not feel that he was in SVT last evening, he has not had similar tightness in the chest prior moderate intensity nothing seems to make it better or worse, denies any other significant pertinent medical history no family history of premature coronary disease, no significant shortness of breath nausea vomiting diaphoresis.  He does have a history of significant reflux disease and is on high-dose omeprazole  and has been for years, he states that he has been heavily drinking over the last several weeks and is attempted to cut back however on Saturday this last weekend he had a very heavy drinking evening and then the day after he felt poorly.  He is unsure if this is related..        Review of Systems   Constitutional symptoms:  No fever, no chills.    Respiratory symptoms:  No shortness of breath, no hemoptysis, no wheezing.    Cardiovascular symptoms:  Chest pain.   Gastrointestinal symptoms:   No abdominal pain,    Musculoskeletal symptoms:  No back pain,    Neurologic symptoms:  No headache,              Additional review of systems information: All other systems reviewed and otherwise negative.      Health Status   Allergies:    Allergic Reactions (Selected)  Moderate  Sulfa drugs- Rash..   Medications:  (Selected)   Inpatient Medications  Ordered  Isovue -370: 100 mL, IV Contrast, Once  Pepcid: 20 mg, 1 tabs, Oral, Once  aluminum hydroxide/magnesium hydroxide/simethicone 200 mg-200 mg-20 mg/5 mL oral suspension: 30 mL, Oral, Once  lidocaine 2% mucous membrane solution: 15 mL, Oral, Once  Documented Medications  Documented  Flonase: Daily, 0 Refill(s)  omeprazole : Oral, Daily, 0 Refill(s).      Past Medical/ Family/ Social History   Medical history: Reviewed as documented in chart.   Surgical history: Reviewed as documented in chart.   Family history: Not significant.   Social history:    Social & Psychosocial Habits    Alcohol  01/18/2019  Use: Current    Type: Beer    Frequency:  3-5 times per week    Substance Use  01/18/2019  Use: Denies    Tobacco  01/18/2019  Use: Former smoker, quit more    Comment: some  chewing - 01/18/2019 08:26 - Ace, RN, Harlene HERO  , Reviewed as documented in chart.   Problem list:    Active Problems (3)  Diverticulitis   Ex-smoker   SVT (supraventricular tachycardia)   .      Physical Examination               Vital Signs   Vital Signs   01/18/2019 8:19 EST Systolic Blood Pressure 170 mmHg  HI    Diastolic Blood Pressure 97 mmHg  HI    Temperature Oral 36.9 degC    Heart Rate Monitored 86 bpm    Respiratory Rate 16 br/min    SpO2 99 %   .   Measurements   01/18/2019 8:25 EST Body Mass Index est meas 32.98 kg/m2   01/18/2019 8:25 EST Body Mass Index Measured 32.98 kg/m2   01/18/2019 8:19 EST Height/Length Measured 178 cm    Weight Dosing 104.5 kg   .   Basic Oxygen Information   01/18/2019 8:19 EST SpO2 99 %    Oxygen Therapy Room air   .   General:  Alert, no acute distress.     Skin:  Warm, dry, pink.    Head:  Normocephalic.   Neck:  Supple, trachea midline.    Eye:  Pupils are equal, round and reactive to light, extraocular movements are intact.    Ears, nose, mouth and throat:  Oral mucosa moist.   Cardiovascular:  Regular rate and rhythm.   Respiratory:  Lungs are clear to auscultation, respirations are non-labored.    Back:  Normal range of motion.   Musculoskeletal:  Normal ROM, no tenderness.    Chest wall   Gastrointestinal:  Soft, Nontender, Non distended, Normal bowel sounds, No organomegaly.    Neurological:  Alert and oriented to person, place, time, and situation, No focal neurological deficit observed, normal sensory observed, normal motor observed.    Lymphatics:  No lymphadenopathy.   Psychiatric:  Cooperative, appropriate mood & affect.       Medical Decision Making   Documents reviewed:  Emergency department nurses' notes.   Electrocardiogram:  Normal sinus rhythm, No ST-T changes, no ectopy, normal PR & QRS intervals, Clinical impression: Sinus rhythm without evidence of acute ischemia. Visualized and interpreted by me.    Results review:  Lab results : Lab View   01/18/2019 8:51 EST Estimated Creatinine Clearance 121.42 mL/min   01/18/2019 8:29 EST WBC 5.8 x10e3/mcL    RBC 5.04 x10e6/mcL    Hgb 15.8 g/dL    HCT 53.7 %    MCV 08.2 fL    MCH 31.3 pg    MCHC 34.2 g/dL    RDW 88.0 %    Platelet 182 x10e3/mcL    MPV 10.8 fL    Neutro Auto 62.5 %    Neutro Absolute 3.6 x10e3/mcL    Immature Grans Percent 0.2 %    Immature Grans Absolute 0.01 x10e3/mcL    Lymph Auto 26.4 %    Lymph Absolute 1.5 x10e3/mcL    Mono Auto 9.5 %    Mono Absolute 0.6 x10e3/mcL    Eosinophil Percent 0.9 %    Eos Absolute 0.1 x10e3/mcL    Basophil Auto 0.5 %    Baso Absolute 0.0 x10e3/mcL    Sodium Lvl 139 mmol/L  Potassium Lvl 4.3 mmol/L    Chloride 102 mmol/L    CO2 25 mmol/L    Glucose Random 124 mg/dL  HI    BUN 14 mg/dL    Creatinine Lvl 1.0 mg/dL    AGAP 13 mmol/L    Osmolality Calc 279  mOsm/kg    Calcium Lvl 9.9 mg/dL    Protein Total 7.4 g/dL    Albumin Lvl 4.8 g/dL    Globulin Calc 3.0 g/dL    AG Ratio Calc 7.99 mmol/L    Alk Phos 94 unit/L    AST 24 unit/L    ALT 45 unit/L  HI    eGFR AA 110 mL/min/1.8m    eGFR Non-AA 95 mL/min/1.61m    Bili Total 0.55 mg/dL    Lipase Lvl 36 unit/L    Trop T Quant <0.010 ng/mL   01/18/2019 8:25 EST Estimated Creatinine Clearance 134.91 mL/min   , Pulse Oximetry is greater than 94% on room air which is acceptable per my interpretation.    Radiology results:  Rad Results (ST)   XR Chest 1 View Portable  ?  01/18/19 08:56:28  CHEST: AP (or PA), 01/18/2019 8:36 AM    INDICATION: Chest pain    COMPARISON: None    FINDINGS: Exam is obtained in relatively poor inspiration. Cardiac silhouette is  not enlarged. No pulmonary vascular congestion or pulmonary edema is  appreciated. No pneumonia. No pleural fluid.    Over the left apex a rounded 12 mm density is noted.      IMPRESSION:    No acute process identified.    12 mm rounded density over the left apex which could be a pulmonary nodule or an  overlying density such as anterior first rib. PA and lordotic views may be  useful, or CT could be obtained.  ?  Signed By: SALLY LYNWOOD MICKEY SABRA      Reexamination/ Reevaluation   Vital signs   Basic Oxygen Information   01/18/2019 8:19 EST SpO2 99 %    Oxygen Therapy Room air      Notes: Patient given GI cocktail and Pepcid, discussed and disclosed chest x-ray findings the patient and we have discussed the risk benefits alternatives of obtaining PA and lateral chest x-ray or CT scan both agree that CT scan will give us  additional information plus rule out other life threats versus additional x-ray imaging.  Patient is still having chest discomfort to his back.      Impression and Plan   Diagnosis   Acute chest pain (ICD10-CM R07.9, Discharge, Medical)   Acute chest pain (ICD10-CM R07.9, Discharge, Medical)   Tight chest (ICD10-CM R07.89, Discharge, Medical)   GERD with  esophagitis (ICD10-CM K21.0, Discharge, Medical)   Palpitations (ICD10-CM R00.2, Discharge, Medical)   Abnormal chest xray (ICD10-CM R93.89, Discharge, Medical)   Plan   Condition: Stable.    Disposition: Discharged: Time  01/18/2019 09:58:00, to home.    Prescriptions: Launch prescriptions   Pharmacy:  Valium 5 mg oral tablet (Prescribe): 5 mg, 1 tabs, Oral, QID, for 3 days, PRN: spasm, 12 tabs, 0 Refill(s)  Maalox oral suspension (Prescribe): 30 mL, Oral, QIDACHS, for 7 days, 840 mL, 0 Refill(s).    Patient was given the following educational materials: Gastroesophageal Reflux Disease, Adult, Easy-to-Read, Food Choices for Gastroesophageal Reflux Disease, Adult, Easy-to-Read, Esophagitis, Nonspecific Chest Pain, Easy-to-Read, Nonspecific Chest Pain, Easy-to-Read, Esophagitis, Food Choices for Gastroesophageal Reflux Disease, Adult, Easy-to-Read, Gastroesophageal Reflux Disease, Adult, Easy-to-Read.    Follow up  with: Follow up with primary care provider Within 1 week; [facility] Within 1 week, [facility] Within 1 week; Follow up with primary care provider Within 1 week.    Counseled: I had a detailed discussion with the patient and/or guardian regarding the historical points/exam findings supporting the discharge diagnosis and need for outpatient followup. Discussed the need to return to the ER if symptoms persist/worsen, or for any questions/concerns that arise at home.

## 2019-01-18 NOTE — ED Notes (Signed)
ED Patient Education Note     Patient Education Materials Follows:  Cardiovascular     Nonspecific Chest Pain    It is often hard to find the cause of chest pain. There is always a chance that your pain could be related to something serious, such as a heart attack or a blood clot in your lungs. Chest pain can also be caused by conditions that are not life-threatening. If you have chest pain, it is very important to follow up with your doctor.          HOME CARE     If you were prescribed an antibiotic medicine, finish it all even if you start to feel better.     Avoid any activities that cause chest pain.     Do not use any tobacco products, including cigarettes, chewing tobacco, or electronic cigarettes. If you need help quitting, ask your doctor.     Do not drink alcohol.     Take medicines only as told by your doctor.     Keep all follow-up visits as told by your doctor. This is important. This includes any further testing if your chest pain does not go away.     Your doctor may tell you to keep your head raised (elevated) while you sleep.     Make lifestyle changes as told by your doctor. These may include:    ? Getting regular exercise. Ask your doctor to suggest some activities that are safe for you.    ? Eating a heart-healthy diet. Your doctor or a diet specialist (dietitian) can help you to learn healthy eating options.    ? Maintaining a healthy weight.    ? Managing diabetes, if necessary.    ? Reducing stress.    GET HELP IF:     Your chest pain does not go away, even after treatment.     You have a rash with blisters on your chest.     You have a fever.    GET HELP RIGHT AWAY IF:     Your chest pain is worse.      You have an increasing cough, or you cough up blood.     You have severe belly (abdominal) pain.     You feel extremely weak.     You pass out (faint).     You have chills.     You have sudden, unexplained chest discomfort.     You have sudden, unexplained discomfort in your arms, back, neck,  or jaw.     You have shortness of breath at any time.     You suddenly start to sweat, or your skin gets clammy.     You feel nauseous.     You vomit.     You suddenly feel light-headed or dizzy.     Your heart begins to beat quickly, or it feels like it is skipping beats.    These symptoms may be an emergency. Do not wait to see if the symptoms will go away. Get medical help right away. Call your local emergency services (911 in the [place] Do not drive yourself to the hospital.    This information is not intended to replace advice given to you by your health care provider. Make sure you discuss any questions you have with your health care provider.    Document Released: 08/07/2007 Document Revised: 03/11/2014 Document Reviewed: 09/24/2013  Elsevier Interactive Patient Education ?2016 Elsevier Inc.        Easy-to-Read     Gastroesophageal Reflux Disease, Adult    Normally, food travels down the esophagus and stays in the stomach to be digested. If a person has gastroesophageal reflux disease (GERD), food and stomach acid move back up into the esophagus. When this happens, the esophagus becomes sore and swollen (inflamed). Over time, GERD can make small holes (ulcers) in the lining of the esophagus.      HOME CARE    Diet?     Follow a diet as told by your doctor. You may need to avoid foods and drinks such as:    ? Coffee and tea (with or without caffeine).    ? Drinks that contain alcohol.    ? Energy drinks and sports drinks.    ? Carbonated drinks or sodas.    ? Chocolate and cocoa.    ? Peppermint and mint flavorings.    ? Garlic and onions.    ? Horseradish.    ? Spicy and acidic foods, such as peppers, chili powder, curry powder, vinegar, hot sauces, and BBQ sauce.    ? Citrus fruit juices and citrus fruits, such as oranges, lemons, and limes.    ? Tomato-based foods, such as red sauce, chili, salsa, and pizza with red sauce.    ? Fried and fatty foods, such as donuts, french fries, potato chips, and high-fat  dressings.    ? High-fat meats, such as hot dogs, rib eye steak, sausage, ham, and bacon.    ? High-fat dairy items, such as whole milk, butter, and cream cheese.     Eat small meals often. Avoid eating large meals.     Avoid drinking large amounts of liquid with your meals.     Avoid eating meals during the 2?3 hours before bedtime.     Avoid lying down right after you eat.     Do not exercise right after you eat.    ?General Instructions?     Pay attention to any changes in your symptoms.     Take over-the-counter and prescription medicines only as told by your doctor. Do not take aspirin, ibuprofen, or other NSAIDs unless your doctor says it is okay.     Do not use any tobacco products, including cigarettes, chewing tobacco, and e-cigarettes. If you need help quitting, ask your doctor.     Wear loose clothes. Do not wear anything tight around your waist.     Raise (elevate) the head of your bed about 6 inches (15 cm).     Try to lower your stress. If you need help doing this, ask your doctor.     If you are overweight, lose an amount of weight that is healthy for you. Ask your doctor about a safe weight loss goal.     Keep all follow-up visits as told by your doctor. This is important.    GET HELP IF:     You have new symptoms.     You lose weight and you do not know why it is happening.     You have trouble swallowing, or it hurts to swallow.     You have wheezing or a cough that keeps happening.     Your symptoms do not get better with treatment.     You have a hoarse voice.    GET HELP RIGHT AWAY IF:     You have pain in your arms, neck, jaw, teeth, or back.     You feel sweaty, dizzy,   or light-headed.     You have chest pain or shortness of breath.     You throw up (vomit) and your throw up looks like blood or coffee grounds.     You pass out (faint).     Your poop (stool) is bloody or black.     You cannot swallow, drink, or eat.    This information is not intended to replace advice given to you by your  health care provider. Make sure you discuss any questions you have with your health care provider.    Document Released: 08/07/2007 Document Revised: 11/09/2014 Document Reviewed: 06/15/2014  Elsevier Interactive Patient Education ?2016 Elsevier Inc.      Gastroenterology     Esophagitis    Esophagitis is inflammation of the esophagus. The esophagus is the tube that carries food and liquids from your mouth to your stomach. Esophagitis can cause soreness or pain in the esophagus. This condition can make it difficult and painful to swallow.       CAUSES    Most causes of esophagitis are not serious. Common causes of this condition include:     Gastroesophageal reflux disease (GERD). This is when stomach contents move back up into the esophagus (reflux).     Repeated vomiting.     An allergic-type reaction, especially caused by food allergies (eosinophilic esophagitis).     Injury to the esophagus by swallowing large pills with or without water, or swallowing certain types of medicines.     Swallowing (ingesting) harmful chemicals, such as household cleaning products.     Heavy alcohol use.     An infection of the esophagus.?This most often occurs in people who have a weakened immune system.     Radiation or chemotherapy treatment for cancer.     Certain diseases such as sarcoidosis, Crohn disease, and scleroderma.    SYMPTOMS    Symptoms of this condition include:     Difficult or painful swallowing.     Pain with swallowing acidic liquids, such as citrus juices.     Pain with burping.     Chest pain.     Difficulty breathing.     Nausea.     Vomiting.     Pain in the abdomen.     Weight loss.     Ulcers in the mouth.     Patches of white material in the mouth (candidiasis).     Fever.     Coughing up blood or vomiting blood.     Stool that is black, tarry, or bright red.    DIAGNOSIS    Your health care provider will take a medical history and perform a physical exam. You may also have other tests, including:     An  endoscopy to examine your stomach and esophagus with a small camera.     A test that measures the acidity level in your esophagus.     A test that measures how much pressure is on your esophagus.     A barium swallow or modified barium swallow to show the shape, size, and functioning of your esophagus.     Allergy tests.    TREATMENT    Treatment for this condition depends on the cause of your esophagitis. In some cases, steroids or other medicines may be given to help relieve your symptoms or to treat the underlying cause of your condition. You may have to make some lifestyle changes, such as:     Avoiding alcohol.  Quitting smoking.     Changing your diet.     Exercising.     Changing your sleep habits and your sleep environment.    HOME CARE INSTRUCTIONS    Take these actions to decrease your discomfort and to help avoid complications.    Diet     Follow a diet as recommended by your health care provider. This may involve avoiding foods and drinks such as:    ? Coffee and tea (with or without caffeine).    ? Drinks that contain alcohol.    ? Energy drinks and sports drinks.    ? Carbonated drinks or sodas.    ? Chocolate and cocoa.    ? Peppermint and mint flavorings.    ? Garlic and onions.    ? Horseradish.    ? Spicy and acidic foods, including peppers, chili powder, curry powder, vinegar, hot sauces, and barbecue sauce.    ? Citrus fruit juices and citrus fruits, such as oranges, lemons, and limes.    ? Tomato-based foods, such as red sauce, chili, salsa, and pizza with red sauce.    ? Fried and fatty foods, such as donuts, french fries, potato chips, and high-fat dressings.    ? High-fat meats, such as hot dogs and fatty cuts of red and [place] meats, such as rib eye steak, sausage, ham, and bacon.    ? High-fat dairy items, such as whole milk, butter, and cream cheese.     Eat small, frequent meals instead of large meals.     Avoid drinking large amounts of liquid with your meals.     Avoid eating meals  during the 2?3 hours before bedtime.     Avoid lying down right after you eat.     Do not exercise right after you eat.     Avoid foods and drinks that seem to make your symptoms worse.    General Instructions     Pay attention to any changes in your symptoms.     Take over-the-counter and prescription medicines only as told by your health care provider. Do not take aspirin, ibuprofen, or other NSAIDs unless your health care provider told you to do so.     If you have trouble taking pills, use a pill splitter to decrease the size of the pill. This will decrease the chance of the pill getting stuck or injuring your esophagus on the way down. Also, drink water after you take a pill.     Do not use any tobacco products, including cigarettes, chewing tobacco, and e-cigarettes. If you need help quitting, ask your health care provider.     Wear loose-fitting clothing. Do not wear anything tight around your waist that causes pressure on your abdomen.     Raise (elevate) the head of your bed about 6 inches (15 cm).     Try to reduce your stress, such as with yoga or meditation. If you need help reducing stress, ask your health care provider.     If you are overweight, reduce your weight to an amount that is healthy for you. Ask your health care provider for guidance about a safe weight loss goal.     Keep all follow-up visits as told by your health care provider. This is important.    SEEK MEDICAL CARE IF:     You have new symptoms.     You have unexplained weight loss.     You have difficulty swallowing, or it hurts to  swallow.     You have wheezing or a persistent cough.     Your symptoms do not improve with treatment.     You have frequent heartburn for more than two weeks.    SEEK IMMEDIATE MEDICAL CARE IF:     You have severe pain in your arms, neck, jaw, teeth, or back.     You feel sweaty, dizzy, or light-headed.     You have chest pain or shortness of breath.     You vomit and your vomit looks like blood or coffee  grounds.     Your stool is bloody or black.     You have a fever.     You cannot swallow, drink, or eat.    This information is not intended to replace advice given to you by your health care provider. Make sure you discuss any questions you have with your health care provider.    Document Released: 03/28/2004 Document Revised: 11/09/2014 Document Reviewed: 06/15/2014  Elsevier Interactive Patient Education ?2016 [place] for Gastroesophageal Reflux Disease, Adult    When you have gastroesophageal reflux disease (GERD), the foods you eat and your eating habits are very important. Choosing the right foods can help ease your discomfort.     WHAT GUIDELINES DO I NEED TO FOLLOW?     Choose fruits, vegetables, whole grains, and low-fat dairy products. ?     Choose low-fat meat, fish, and poultry.     Limit fats such as oils, salad dressings, butter, nuts, and avocado.   ?     Keep a food diary. This helps you identify foods that cause symptoms. ?     Avoid foods that cause symptoms. These may be different for everyone. ?     Eat small meals often instead of 3 large meals a day. ?     Eat your meals slowly, in a place where you are relaxed. ?     Limit fried foods. ?     Cook foods using methods other than frying. ?     Avoid drinking alcohol. ?     Avoid drinking large amounts of liquids with your meals. ?     Avoid bending over or lying down until 2?3 hours after eating. ?    WHAT FOODS ARE NOT RECOMMENDED?    These are some foods and drinks that may make your symptoms worse:    Vegetables    Tomatoes. Tomato juice. Tomato and spaghetti sauce. Chili peppers. Onion and garlic. Horseradish.    Fruits    Oranges, grapefruit, and lemon (fruit and juice).    Meats    High-fat meats, fish, and poultry. This includes hot dogs, ribs, ham, sausage, salami, and bacon.    Dairy    Whole milk and chocolate milk. Sour cream. Cream. Butter. Ice cream. Cream cheese.     Drinks    Coffee and  tea. Bubbly (carbonated) drinks or energy drinks.    Condiments    Hot sauce. Barbecue sauce.     Sweets/Desserts    Chocolate and cocoa. Donuts. Peppermint and spearmint.    Fats and Oils    High-fat foods. This includes [place] fries and potato chips.    Other    Vinegar. Strong spices. This includes black pepper, white pepper, red pepper, cayenne, curry powder, cloves, ginger, and chili powder.    The items listed above may not be  a complete list of foods and drinks to avoid. Contact your dietitian for more information.    This information is not intended to replace advice given to you by your health care provider. Make sure you discuss any questions you have with your health care provider.    Document Released: 08/20/2011 Document Revised: 03/11/2014 Document Reviewed: 12/23/2012  Elsevier Interactive Patient Education ?2016 Elsevier Inc.

## 2019-02-02 LAB — CBC WITH AUTO DIFFERENTIAL
Absolute Baso #: 0.1 10*3/uL (ref 0.0–0.2)
Absolute Eos #: 0.1 10*3/uL (ref 0.0–0.5)
Absolute Lymph #: 1.7 10*3/uL (ref 1.0–3.2)
Absolute Mono #: 0.6 10*3/uL (ref 0.3–1.0)
Basophils %: 0.9 % (ref 0.0–2.0)
Eosinophils %: 2.1 % (ref 0.0–7.0)
Hematocrit: 44 % (ref 38.0–52.0)
Hemoglobin: 14.9 g/dL (ref 13.0–17.3)
Immature Grans (Abs): 0.01 10*3/uL (ref 0.00–0.06)
Immature Granulocytes: 0.2 % (ref 0.1–0.6)
Lymphocytes: 28.9 % (ref 15.0–45.0)
MCH: 31.1 pg (ref 27.0–34.5)
MCHC: 33.9 g/dL (ref 32.0–36.0)
MCV: 91.9 fL (ref 84.0–100.0)
MPV: 12.3 fL (ref 7.2–13.2)
Monocytes: 10.6 % (ref 4.0–12.0)
NRBC Absolute: 0 10*3/uL (ref 0.000–0.012)
NRBC Automated: 0 % (ref 0.0–0.2)
Neutrophils %: 57.3 % (ref 42.0–74.0)
Neutrophils Absolute: 3.3 10*3/uL (ref 1.6–7.3)
Platelets: 165 10*3/uL (ref 140–440)
RBC: 4.79 x10e6/mcL (ref 4.00–5.60)
RDW: 12.3 % (ref 11.0–16.0)
WBC: 5.7 10*3/uL (ref 3.8–10.6)

## 2019-02-02 LAB — COMPREHENSIVE METABOLIC PANEL
ALT: 45 U/L — ABNORMAL HIGH (ref 0–41)
AST: 21 U/L (ref 0–40)
Albumin/Globulin Ratio: 2.2 mmol/L — ABNORMAL HIGH (ref 1.00–2.00)
Albumin: 4.9 g/dL (ref 3.5–5.2)
Alk Phosphatase: 90 U/L (ref 40–130)
Anion Gap: 12 mmol/L (ref 2–17)
BUN: 16 mg/dL (ref 6–20)
CO2: 25 mmol/L (ref 22–29)
Calcium: 9.4 mg/dL (ref 8.6–10.0)
Chloride: 104 mmol/L (ref 98–107)
Creatinine: 1 mg/dL (ref 0.7–1.3)
GFR African American: 110 mL/min/{1.73_m2} (ref >=90)
GFR Non-African American: 95 mL/min/{1.73_m2} (ref >=90)
Globulin: 2 g/dL (ref 1.9–4.4)
Glucose: 113 mg/dL — ABNORMAL HIGH (ref 70–99)
OSMOLALITY CALCULATED: 283 mOsm/kg (ref 270–287)
Potassium: 4.6 mmol/L (ref 3.5–5.3)
Sodium: 141 mmol/L (ref 135–145)
Total Bilirubin: 0.4 mg/dL (ref 0.00–1.20)
Total Protein: 7.1 g/dL (ref 6.4–8.3)

## 2019-02-02 LAB — LIPID PANEL
Chol/HDL Ratio: 4.7 — ABNORMAL HIGH (ref 0.0–4.4)
Cholesterol: 225 mg/dL — ABNORMAL HIGH (ref 100–200)
HDL: 48 mg/dL — ABNORMAL LOW (ref 55–72)
LDL Cholesterol: 146.8 mg/dL — ABNORMAL HIGH (ref 0.0–100.0)
LDL/HDL Ratio: 3.1
Triglycerides: 151 mg/dL — ABNORMAL HIGH (ref 0–149)
VLDL: 30.2 mg/dL (ref 5.0–40.0)

## 2019-02-02 LAB — TSH WITH REFLEX TO FT4: TSH: 1.62 mcIU/mL (ref 0.358–3.740)

## 2019-02-02 LAB — PSA SCREENING: Screening PSA: 0.626 ng/mL (ref 0.000–4.000)

## 2019-02-03 LAB — HEMOGLOBIN A1C
Est. Avg. Glucose, WB: 111
Est. Avg. Glucose-calculated: 118
Hemoglobin A1C: 5.5 % (ref 4.0–6.0)

## 2019-02-03 LAB — URINALYSIS WITH REFLEX TO CULTURE
Amorphous, UA: NONE SEEN /HPF
BACTERIA, URINE: NONE SEEN
Bilirubin Urine: NEGATIVE
Blood, Urine: NEGATIVE
Glucose, UA: NEGATIVE mg/dL
Ketones, Urine: NEGATIVE mg/dL
Leukocyte Esterase, Urine: NEGATIVE
MUCUS, URINE: NONE SEEN /LPF
Nitrite, Urine: NEGATIVE
Protein, UA: NEGATIVE
RBC, UA: NONE SEEN /HPF (ref 0–2)
Specific Gravity, UA: 1.015 (ref 1.003–1.035)
Urobilinogen, Urine: 0.2 EU/dL
WBC, UA: NONE SEEN /HPF (ref 0–2)
pH, UA: 6 (ref 4.5–8.0)

## 2019-02-25 NOTE — Discharge Summary (Signed)
ED Clinical Summary                         [place] [address]  [phone]           PERSON INFORMATION  Name: [name], [name] Age:  38 Years DOB: 1981/02/06   Sex: Male Language: English PCP: Benetta Spar   Marital Status:  Single Phone: [phone] Med Service: MED-Medicine   MRN:  [id number] Acct# [id number] Arrival: 02/25/2019 21:06:00   Visit Reason: Rash; RASH Acuity: 4 LOS: 000 00:42   Address:      [address] [place] [address]  Diagnosis:      Hives  Printed Prescriptions:            Allergies      sulfa drugs (rash)      Medications Administered During Visit:                  Medication Dose Route   methylPREDNISolone 125 mg IM   hydrOXYzine 25 mg Oral       Patient Medication List:              fluticasone nasal (Flonase) every day.  hydrOXYzine (hydrOXYzine hydrochloride 25 mg oral tablet) 1 Tabs Oral (given by mouth) 3 times a day for 7 Days. Refills: 0.  methylPREDNISolone (Medrol Dosepak 4 mg oral tablet) 1 Packets Oral (given by mouth) every day for 6 Days. as directed on package labeling. Refills: 0.  omeprazole Oral (given by mouth) every day.         Major Tests and Procedures:  The following procedures and tests were performed during your ED visit.  COMMONPROCEDURES%>  COMMON PROCEDURESCOMMENTS%>          Laboratory Orders  No laboratory orders were placed.              Radiology Orders  No radiology orders were placed.              Patient Care Orders  Name Status Details   Discharge Patient Ordered 02/25/19 21:38:00 EST   ED Assessment Adult Completed 02/25/19 21:19:07 EST, 02/25/19 21:19:07 EST   ED Secondary Triage Completed 02/25/19 21:19:07 EST, 02/25/19 21:19:07 EST   ED Triage Adult Completed 02/25/19 21:06:53 EST, 02/25/19 21:06:53 EST             PROVIDER INFORMATION               Provider Role Assigned Beryle Beams ED Provider 02/25/2019 21:12:14    [place] ED Nurse 02/25/2019 21:22:47        Attending Physician:  Tobe Sos C     Admit Doc  HOSKINS-MD,  MATTHEW C     Consulting Doc       VITALS INFORMATION  Vital Sign Triage Latest   Temp Oral ORAL_1%>37 degC ORAL%>37 degC   Temp Temporal TEMPORAL_1%> TEMPORAL%>   Temp Intravascular INTRAVASCULAR_1%> INTRAVASCULAR%>   Temp Axillary AXILLARY_1%> AXILLARY%>   Temp Rectal RECTAL_1%> RECTAL%>   02 Sat 98 % 98 %   Respiratory Rate RATE_1%>16 br/min RATE%>16 br/min   Peripheral Pulse Rate PULSE RATE_1%> PULSE RATE%>   Apical Heart Rate HEART RATE_1%> HEART RATE%>   Blood Pressure BLOOD PRESSURE_1%>/ BLOOD PRESSURE_1%>109 mmHg BLOOD PRESSURE%>160 mmHg / BLOOD PRESSURE%>109 mmHg  Immunizations      No Immunizations Documented This Visit          DISCHARGE INFORMATION   Discharge Disposition: H Outpt-Sent Home   Discharge Location:    Home   Discharge Date and Time:    02/25/2019 21:48:00   ED Checkout Date and Time:    02/25/2019 21:48:00     DEPART REASON INCOMPLETE INFORMATION               Depart Action Incomplete Reason   Interactive View/I&O Recently assessed               Problems      No Problems Documented              Smoking Status      Former smoker, quit more than 30 days ago         PATIENT EDUCATION INFORMATION  Instructions:       Hives, Easy-to-Read     Follow up:                    With: Address: When:   [facility] Call for appt & office location   [phone] Business (1) Within 1 week           ED PROVIDER DOCUMENTATION

## 2019-02-25 NOTE — ED Notes (Signed)
ED Patient Education Note     Patient Education Materials Follows:  Allergy     Hives    Hives are itchy, red, puffy (swollen) areas of the skin. Hives can change in size and location on your body. Hives can come and go for hours, days, or weeks. Hives do not spread from person to person (noncontagious). Scratching, exercise, and stress can make your hives worse.      HOME CARE     Avoid things that cause your hives (triggers).     Take antihistamine medicines as told by your doctor. Do not drive while taking an antihistamine.     Take any other medicines for itching as told by your doctor.     Wear loose-fitting clothing.     Keep all doctor visits as told.    GET HELP RIGHT AWAY IF:     You have a fever.     Your tongue or lips are puffy.     You have trouble breathing or swallowing.     You feel tightness in the throat or chest.     You have belly (abdominal) pain.     You have lasting or severe itching that is not helped by medicine.     You have painful or puffy joints.    These problems may be the first sign of a life-threatening allergic reaction. Call your local emergency services (911 in [place]    MAKE SURE YOU:     Understand these instructions.     Will watch your condition.     Will get help right away if you are not doing well or get worse.    This information is not intended to replace advice given to you by your health care provider. Make sure you discuss any questions you have with your health care provider.    Document Released: 11/28/2007 Document Revised: 08/20/2011 Document Reviewed: 12/07/2014  Elsevier Interactive Patient Education ?2016 Elsevier Inc.

## 2019-02-25 NOTE — ED Notes (Signed)
ED Triage Note       ED Triage Adult Entered On:  02/25/2019 21:19 EST    Performed On:  02/25/2019 21:15 EST by Wyvonna Plum, RN, Comer Locket               Triage   Chief Complaint :   pt states rash that comes and goes around waist and groin then on butt cheeks and thighs; no change in soaps. has epigastric pain and took tums. has chronic gi hx. has been taking prednisone for several days prescribed by pcp.    Numeric Rating Pain Scale :   0 = No pain   [place] Mode of Arrival :   Private vehicle   Infectious Disease Documentation :   Document assessment   Temperature Oral :   37 degC(Converted to: 98.6 degF)    Heart Rate Monitored :   76 bpm   Respiratory Rate :   16 br/min   Systolic Blood Pressure :   160 mmHg (HI)    Diastolic Blood Pressure :   109 mmHg (>HHI)    SpO2 :   98 %   Oxygen Therapy :   Room air   Patient presentation :   None of the above   Chief Complaint or Presentation suggest infection :   Yes   Weight Dosing :   102 kg(Converted to: 224 lb 14 oz)    Height :   178 cm(Converted to: 5 ft 10 in)    Body Mass Index Dosing :   32 kg/m2   [place], RN, Comer Locket - 02/25/2019 21:15 EST   DCP GENERIC CODE   Tracking Acuity :   4   Tracking Group :   ED [employer]   [place], RN, Comer Locket - 02/25/2019 21:15 EST   ED General Section :   Document assessment   Pregnancy Status :   N/A   ED Allergies Section :   Document assessment   ED Reason for Visit Section :   Document assessment   [place], RN, Comer Locket - 02/25/2019 21:15 EST   ID Risk Screen Symptoms   Recent Travel History :   No recent travel   Close Contact with COVID-19 ID :   No   Last 14 days COVID-19 ID :   No   TB Symptom Screen :   No symptoms   C. diff Symptom/History ID :   Neither of the above   [place], RN, Comer Locket - 02/25/2019 21:15 EST   Allergies   (As Of: 02/25/2019 21:19:06 EST)   Allergies (Active)   sulfa drugs  Estimated Onset Date:   Unspecified ; Reactions:   rash ;  Created By:   Donivan Scull RN, Anderson Malta; Reaction Status:   Active ; Category:   Drug ; Substance:   sulfa drugs ; Type:   Allergy ; Severity:   Moderate ; Updated By:   Donivan Scull RN, Anderson Malta; Reviewed Date:   02/25/2019 21:17 EST        Psycho-Social   Last 3 mo, thoughts killing self/others :   Patient denies   Injuries/Abuse/Neglect in Household :   Denies   Feels Unsafe at Home :   No   ED Behavioral Activity Rating Scale :   4 - Quiet and awake (normal level of activity)   [place], RN, Comer Locket - 02/25/2019 21:15 EST   ED Reason for Visit   (As Of: 02/25/2019 21:19:06 EST)   Problems(Active)  Diverticulitis (SNOMED CT  :662947654 )  Name of Problem:   Diverticulitis ; Recorder:   Ace, RN, Maricela Bo; Confirmation:   Confirmed ; Classification:   Patient Stated ; Code:   [id number] ; Contributor System:   Conservation officer, nature ; Last Updated:   01/18/2019 8:24 EST ; Life Cycle Date:   01/18/2019 ; Life Cycle Status:   Active ; Vocabulary:   SNOMED CT        Ex-smoker (SNOMED CT  :81275170 )  Name of Problem:   Ex-smoker ; Recorder:   Ace, RN, Maricela Bo; Confirmation:   Confirmed ; Classification:   Patient Stated ; Code:   [id number] ; Contributor System:   PowerChart ; Last Updated:   01/18/2019 8:25 EST ; Life Cycle Date:   01/18/2019 ; Life Cycle Status:   Active ; Vocabulary:   SNOMED CT        SVT (supraventricular tachycardia) (SNOMED CT  :67591638 )  Name of Problem:   SVT (supraventricular tachycardia) ; Recorder:   Ace, RN, Maricela Bo; Confirmation:   Confirmed ; Classification:   Patient Stated ; Code:   [id number] ; Contributor System:   PowerChart ; Last Updated:   01/18/2019 8:24 EST ; Life Cycle Date:   01/18/2019 ; Life Cycle Status:   Active ; Vocabulary:   SNOMED CT          Diagnoses(Active)    Rash  Date:   02/25/2019 ; Diagnosis Type:   Reason For Visit ; Confirmation:   Complaint of ; Clinical Dx:   Rash ; Classification:   Medical ; Clinical Service:   Emergency medicine ; Code:   PNED ; Probability:   0 ;  Diagnosis Code:   [id number]

## 2019-02-25 NOTE — ED Provider Notes (Signed)
Skin Problem *ED        Patient:   [name], [name]            MRN: [id number]            FIN: [id number]               Age:   38 years     Sex:  Male     DOB:  1981/03/01   Associated Diagnoses:   Hives   Author:   Jerrell Belfast      Basic Information   Additional information: Chief Complaint from Nursing Triage Note   Chief Complaint  Chief Complaint: pt states rash that comes and goes around waist and groin then on butt cheeks and thighs; no change in soaps. has epigastric pain and took tums. has chronic gi hx. has been taking prednisone for several days prescribed by pcp. (02/25/19 21:15:00).      History of Present Illness   38 year old male presents with concerns of hives.  He has had these intermittently over the past 6 months, have resolved with steroids in the past, his current episode started a few days ago, started a prednisone taper which was helping however tonight he got a lot of hives on his buttock and abdomen as well as his neck and behind his ears.  He took some Pepcid and came to the emergency department.  Denies any other symptoms including shortness of breath, mucosal swelling.  He has no known exposures.  Symptoms are mild.      Review of Systems             Additional review of systems information: All other systems reviewed and otherwise negative.      Health Status   Allergies:    Allergic Reactions (Selected)  Moderate  Sulfa drugs- Rash..   Medications:  (Selected)   Documented Medications  Documented  Flonase: Daily, 0 Refill(s)  omeprazole: Oral, Daily, 0 Refill(s).      Past Medical/ Family/ Social History   Medical history: Reviewed as documented in chart.   Surgical history:    Hernia repair (16073710).  Tonsils and adenoids (626948546)., Reviewed as documented in chart.   Family history:    No family history items have been selected or recorded., Reviewed as documented in chart.   Social history:    Social & Psychosocial Habits    Alcohol  01/18/2019  Use: Current    Type:  Beer    Frequency: 3-5 times per week    Substance Use  01/18/2019  Use: Denies    Tobacco  01/18/2019  Use: Former smoker, quit more    Comment: some  chewing - 01/18/2019 08:26 - Ace, RN, Anderson Malta  , Reviewed as documented in chart.   Problem list:    Active Problems (3)  Diverticulitis   Ex-smoker   SVT (supraventricular tachycardia)   , per nurse's notes.      Physical Examination               Vital Signs   Vital Signs   02/25/2019 21:15 EST Systolic Blood Pressure 160 mmHg  HI    Diastolic Blood Pressure 109 mmHg  >HHI    Temperature Oral 37 degC    Heart Rate Monitored 76 bpm    Respiratory Rate 16 br/min    SpO2 98 %   .   General:  Alert, no acute distress.    Skin:  Warm, dry.  Head:  Normocephalic, atraumatic.    Neck:  Supple, trachea midline.    Eye:  Pupils are equal, round and reactive to light, extraocular movements are intact.    Cardiovascular:  Normal peripheral perfusion, No edema.    Respiratory:  Respirations are non-labored.   Gastrointestinal:  Non distended.   Musculoskeletal:  Normal ROM, normal strength.    Neurological:  Alert and oriented to person, place, time, and situation, No focal neurological deficit observed, CN II-XII intact, normal sensory observed, normal motor observed, normal speech observed, normal coordination observed.    Psychiatric:  Cooperative, appropriate mood & affect.       Medical Decision Making   Differential Diagnosis:  Hives.   Documents reviewed:  Emergency department nurses' notes.      Impression and Plan   Diagnosis   Hives (ICD10-CM L50.9, Discharge, Medical)   Plan   Condition: Improved, Stable.    Disposition: Medically cleared, Discharged: to home.    Prescriptions: Launch prescriptions   Pharmacy:  hydrOXYzine hydrochloride 25 mg oral tablet (Prescribe): 25 mg, 1 tabs, Oral, TID, for 7 days, 21 tabs, 0 Refill(s)  Medrol Dosepak 4 mg oral tablet (Prescribe): 1 packets, Oral, Daily, for 6 days, as directed on package labeling, 21 tabs, 0 Refill(s).     Patient was given the following educational materials: Hives, Easy-to-Read, Hives, Easy-to-Read.    Follow up with: [facility] Within 1 week.    Counseled: Patient, Regarding diagnosis, Regarding diagnostic results, Regarding treatment plan, Regarding prescription, Patient indicated understanding of instructions.    Signature Line     Electronically Signed on 02/25/2019 09:49 PM EST   ________________________________________________   Marguerita Beards               Modified by: Tobe Sos C on 02/25/2019 09:45 PM EST      Modified by: Cephas Darby,  Jaydyn Menon C on 02/25/2019 09:49 PM EST

## 2019-02-25 NOTE — ED Notes (Signed)
ED Triage Note       ED Secondary Triage Entered On:  02/25/2019 21:38 EST    Performed On:  02/25/2019 21:38 EST by Roberto PromiseJaskiewicz,  Brittany               General Information   Barriers to Learning :   None evident   Influenza Vaccine Status :   Received prior to admission, during current flu season   Pneumococcal Vaccine Status :   Not received   ED Home Meds Section :   Document assessment   [facility] ED Fall Risk Section :   Document assessment   ED History Section :   Document assessment   ED Advance Directives Section :   Document assessment   ED Palliative Screen :   N/A (prefilled for <65yo)   Roberto PromiseJaskiewicz,  Brittany - 02/25/2019 21:38 EST   (As Of: 02/25/2019 21:38:43 EST)   Problems(Active)    Diverticulitis (SNOMED CT  :161096045450775010 )  Name of Problem:   Diverticulitis ; Recorder:   Ace, RN, Anderson MaltaJessica M; Confirmation:   Confirmed ; Classification:   Patient Stated ; Code:   [id number] ; Contributor System:   DietitianowerChart ; Last Updated:   01/18/2019 8:24 EST ; Life Cycle Date:   01/18/2019 ; Life Cycle Status:   Active ; Vocabulary:   SNOMED CT        Ex-smoker (SNOMED CT  :7829562115047015 )  Name of Problem:   Ex-smoker ; Recorder:   Ace, RN, Anderson MaltaJessica M; Confirmation:   Confirmed ; Classification:   Patient Stated ; Code:   [id number] ; Contributor System:   PowerChart ; Last Updated:   01/18/2019 8:25 EST ; Life Cycle Date:   01/18/2019 ; Life Cycle Status:   Active ; Vocabulary:   SNOMED CT        SVT (supraventricular tachycardia) (SNOMED CT  :6962952811705016 )  Name of Problem:   SVT (supraventricular tachycardia) ; Recorder:   Ace, RN, Anderson MaltaJessica M; Confirmation:   Confirmed ; Classification:   Patient Stated ; Code:   [id number] ; Contributor System:   PowerChart ; Last Updated:   01/18/2019 8:24 EST ; Life Cycle Date:   01/18/2019 ; Life Cycle Status:   Active ; Vocabulary:   SNOMED CT          Diagnoses(Active)    Hives  Date:   02/25/2019 ; Diagnosis Type:   Discharge ; Confirmation:   Confirmed ; Clinical Dx:   Hives ;  Classification:   Medical ; Clinical Service:   Non-Specified ; Code:   ICD-10-CM ; Probability:   0 ; Diagnosis Code:   L50.9      Rash  Date:   02/25/2019 ; Diagnosis Type:   Reason For Visit ; Confirmation:   Complaint of ; Clinical Dx:   Rash ; Classification:   Medical ; Clinical Service:   Emergency medicine ; Code:   PNED ; Probability:   0 ; Diagnosis Code:   [id number]             -    Procedure History   (As Of: 02/25/2019 21:38:43 EST)     Anesthesia Minutes:   0 ; Procedure Name:   Hernia repair ; Procedure Minutes:   0            Anesthesia Minutes:   0 ; Procedure Name:   Tonsils and adenoids ; Procedure Minutes:   0  [facility] Fall Risk Assessment Tool   Hx of falling last 3 months ED Fall :   No   Patient confused or disoriented ED Fall :   No   Patient intoxicated or sedated ED Fall :   No   Patient impaired gait ED Fall :   No   Use a mobility assistance device ED Fall :   No   Patient altered elimination ED Fall :   No   [facility] ED Fall Score :   0    [name] - 02/25/2019 21:38 EST   ED Advance Directive   Advance Directive :   No   [name] - 02/25/2019 21:38 EST   Social History   Social History   (As Of: 02/25/2019 21:38:43 EST)   Tobacco:        Tobacco use: Former smoker, quit more than 30 days ago.   Comments:  01/18/2019 8:26 - Ace, RN, Anderson Malta: some  chewing   (Last Updated: 01/18/2019 08:26:43 EST by Ace, RN, Anderson Malta)          Alcohol:        Current, Beer, 3-5 times per week   (Last Updated: 01/18/2019 08:27:11 EST by Ace, RN, Anderson Malta)          Substance Use:        Denies   (Last Updated: 01/18/2019 08:27:16 EST by Ace, RN, Anderson Malta)            Med Hx   Medication List   (As Of: 02/25/2019 21:38:43 EST)   Normal Order    hydrOXYzine hydrochloride 25 mg Tab  :   hydrOXYzine hydrochloride 25 mg Tab ; Status:   Ordered ; Ordered As Mnemonic:   hydrOXYzine hydrochloride ; Simple Display Line:   25 mg, 1 tabs, Oral, Once ;  Ordering Provider:   HOSKINS-MD,  MATTHEW C; Catalog Code:   hydrOXYzine ; Order Dt/Tm:   02/25/2019 21:33:37 EST          methylPREDNISolone 125 mg preservative-free Inj  :   methylPREDNISolone 125 mg preservative-free Inj ; Status:   Ordered ; Ordered As Mnemonic:   methylPREDNISolone (Solu-MEDROL) ; Simple Display Line:   125 mg, 1 EA, IM, Once ; Ordering Provider:   HOSKINS-MD,  MATTHEW C; Catalog Code:   methylPREDNISolone ; Order Dt/Tm:   02/25/2019 21:33:05 EST            Prescription/Discharge Order    hydrOXYzine  :   hydrOXYzine ; Status:   Prescribed ; Ordered As Mnemonic:   hydrOXYzine hydrochloride 25 mg oral tablet ; Simple Display Line:   25 mg, 1 tabs, Oral, TID, for 7 days, 21 tabs, 0 Refill(s) ; Ordering Provider:   Jerrell Belfast; Catalog Code:   hydrOXYzine ; Order Dt/Tm:   02/25/2019 21:37:23 EST          methylPREDNISolone  :   methylPREDNISolone ; Status:   Prescribed ; Ordered As Mnemonic:   Medrol Dosepak 4 mg oral tablet ; Simple Display Line:   1 packets, Oral, Daily, for 6 days, as directed on package labeling, 21 tabs, 0 Refill(s) ; Ordering Provider:   Jerrell Belfast; Catalog Code:   methylPREDNISolone ; Order Dt/Tm:   02/25/2019 21:37:11 EST            Home Meds    fluticasone nasal  :   fluticasone nasal ; Status:   Documented ; Ordered As Mnemonic:  Flonase ; Simple Display Line:   Daily, 0 Refill(s) ; Catalog Code:   fluticasone nasal ; Order Dt/Tm:   01/18/2019 08:25:59 EST          omeprazole  :   omeprazole ; Status:   Documented ; Ordered As Mnemonic:   omeprazole ; Simple Display Line:   Oral, Daily, 0 Refill(s) ; Catalog Code:   omeprazole ; Order Dt/Tm:   01/18/2019 08:25:54 EST

## 2019-02-25 NOTE — ED Notes (Signed)
 ED Patient Summary       ;          [facility]  [address] [place], [place] [address]  [phone]  Discharge Instructions (Patient)  _______________________________________     Name:[name], [name]  DOB:  02-22-1981                   MRN: [id number]                   FIN: [id number]  Reason For Visit: Rash; RASH  Final Diagnosis: Hives     Visit Date: 02/25/2019 21:06:00  Address: [address] [place] [address]  Phone: [phone]     Emergency Department Providers:         Primary Physician:   FREDERICH DONNICE BROCKS         [facility] would like to thank you for allowing us  to assist you with your healthcare needs. The following includes patient education materials and information regarding your injury/illness.     Follow-up Instructions:  You were seen today on an emergency basis. Please contact your primary care doctor for a follow up appointment. If you received a referral to a specialist doctor, it is important you follow-up as instructed.    It is important that you call your follow-up doctor to schedule and confirm the location of your next appointment. Your doctor may practice at multiple locations. The office location of your follow-up appointment may be different to the one written on your discharge instructions.    If you do not have a primary care doctor, please call [phone] for help in finding a [name] Provider. For help in finding a specialist doctor, please call [phone].    The [facility] "Ask a Nurse" line in staffed by Registered Nurses and is a free service to the community. We are available Monday - Friday from 8am to 5pm to answer your questions about your health. Please call [phone].    If your condition gets worse before your follow-up with your primary care doctor or specialist, please return to the Emergency Department.        Follow Up Appointments:  Primary Care  Provider:      Name: [name]      Phone: [phone]                 With: Address: When:   [facility] Call for appt & office location   [phone] Business (1) Within 1 week              Printed Prescriptions:    Patient Education Materials:  Discharge Orders          Discharge Patient 02/25/19 21:38:00 EST         Comment:      Hives, Easy-to-Read     Hives    Hives are itchy, red, puffy (swollen) areas of the skin. Hives can change in size and location on your body. Hives can come and go for hours, days, or weeks. Hives do not spread from person to person (noncontagious). Scratching, exercise, and stress can make your hives worse.      HOME CARE     Avoid things that cause your hives (triggers).     Take antihistamine medicines as told by your doctor. Do not drive while taking an antihistamine.     Take any  other medicines for itching as told by your doctor.     Wear loose-fitting clothing.     Keep all doctor visits as told.    GET HELP RIGHT AWAY IF:     You have a fever.     Your tongue or lips are puffy.     You have trouble breathing or swallowing.     You feel tightness in the throat or chest.     You have belly (abdominal) pain.     You have lasting or severe itching that is not helped by medicine.     You have painful or puffy joints.    These problems may be the first sign of a life-threatening allergic reaction. Call your local emergency services (911 in [place]    MAKE SURE YOU:     Understand these instructions.     Will watch your condition.     Will get help right away if you are not doing well or get worse.    This information is not intended to replace advice given to you by your health care provider. Make sure you discuss any questions you have with your health care provider.    Document Released: 11/28/2007 Document Revised: 08/20/2011 Document Reviewed: 12/07/2014  Elsevier Interactive Patient Education ?2016 Elsevier Inc.         Allergy Info: sulfa drugs     Medication  Information:  [name] ED Physicians provided you with a complete list of medications post discharge, if you have been instructed to stop taking a medication please ensure you also follow up with this information to your Primary Care Physician.  Unless otherwise noted, patient will continue to take medications as prescribed prior to the Emergency Room visit.  Any specific questions regarding your chronic medications and dosages should be discussed with your physician(s) and pharmacist.          fluticasone nasal (Flonase) every day.  hydrOXYzine (hydrOXYzine hydrochloride 25 mg oral tablet) 1 Tabs Oral (given by mouth) 3 times a day for 7 Days. Refills: 0.  methylPREDNISolone  (Medrol  Dosepak 4 mg oral tablet) 1 Packets Oral (given by mouth) every day for 6 Days. as directed on package labeling. Refills: 0.  omeprazole  Oral (given by mouth) every day.      Medications Administered During Visit:              Medication Dose Route   methylPREDNISolone  125 mg IM   hydrOXYzine 25 mg Oral          Major Tests and Procedures:  The following procedures and tests were performed during your ED visit.  PROCEDURES%>  PROCEDURES COMMENTS%>          Laboratory Orders  No laboratory orders were placed.              Radiology Orders  No radiology orders were placed.              Patient Care Orders  Name Status Details   Discharge Patient Ordered 02/25/19 21:38:00 EST   ED Assessment Adult Completed 02/25/19 21:19:07 EST, 02/25/19 21:19:07 EST   ED Secondary Triage Completed 02/25/19 21:19:07 EST, 02/25/19 21:19:07 EST   ED Triage Adult Completed 02/25/19 21:06:53 EST, 02/25/19 21:06:53 EST       ---------------------------------------------------------------------------------------------------------------------  [facility]) encourages you to self-enroll in the [facility] Patient Portal.  [facility] Patient Portal will allow you to manage your personal health  information securely from  your own electronic device now and in the future.  To begin your Patient Portal enrollment process, please visit [email or web address] Click on "Sign up now" under [facility].  If you find that you need additional assistance on the [facility] Patient Portal or need a copy of your medical records, please call the [facility] Medical Records Office at [phone].  Comment:

## 2019-03-02 LAB — URINALYSIS WITH MICROSCOPIC
Bilirubin Urine: NEGATIVE
Blood, Urine: NEGATIVE
Glucose, UA: NEGATIVE mg/dL
Ketones, Urine: NEGATIVE mg/dL
Leukocyte Esterase, Urine: NEGATIVE
Nitrite, Urine: NEGATIVE
Protein, UA: NEGATIVE
Specific Gravity, UA: <=1.005 (ref 1.003–1.035)
Urobilinogen, Urine: 0.2 EU/dL
pH, UA: 6 (ref 4.5–8.0)

## 2019-03-02 LAB — URINALYSIS W/ RFLX MICROSCOPIC
Bilirubin Urine: NEGATIVE
Blood, Urine: NEGATIVE
Glucose, UA: NEGATIVE mg/dL
Ketones, Urine: NEGATIVE mg/dL
Leukocyte Esterase, Urine: NEGATIVE
Nitrite, Urine: NEGATIVE
Protein, UA: NEGATIVE
Specific Gravity, UA: <=1.005 (ref 1.003–1.035)
Urobilinogen, Urine: 0.2 EU/dL
pH, UA: 6.5 (ref 4.5–8.0)

## 2019-03-02 NOTE — ED Notes (Signed)
ED Triage Note       ED Secondary Triage Entered On:  03/02/2019 13:30 EST    Performed On:  03/02/2019 13:30 EST by Jenness Corner               General Information   Barriers to Learning :   None evident   Pt. Currently Receiving Radiation :   No   Influenza Vaccine Status :   Received prior to admission, during current flu season   Pneumococcal Vaccine Status :   Not received   ED Home Meds Section :   Document assessment   [facility] ED Fall Risk Section :   Document assessment   ED History Section :   Document assessment   ED Advance Directives Section :   Document assessment   ED Palliative Screen :   N/A (prefilled for <65yo)   Jenness Corner - 03/02/2019 13:30 EST   (As Of: 03/02/2019 13:30:59 EST)   Problems(Active)    Diverticulitis (SNOMED CT  :161096045 )  Name of Problem:   Diverticulitis ; Recorder:   Ace, RN, Maricela Bo; Confirmation:   Confirmed ; Classification:   Patient Stated ; Code:   [id number] ; Contributor System:   Conservation officer, nature ; Last Updated:   01/18/2019 8:24 EST ; Life Cycle Date:   01/18/2019 ; Life Cycle Status:   Active ; Vocabulary:   SNOMED CT        Ex-smoker (SNOMED CT  :78295621 )  Name of Problem:   Ex-smoker ; Recorder:   Ace, RN, Maricela Bo; Confirmation:   Confirmed ; Classification:   Patient Stated ; Code:   [id number] ; Contributor System:   PowerChart ; Last Updated:   01/18/2019 8:25 EST ; Life Cycle Date:   01/18/2019 ; Life Cycle Status:   Active ; Vocabulary:   SNOMED CT        SVT (supraventricular tachycardia) (SNOMED CT  :69629528 )  Name of Problem:   SVT (supraventricular tachycardia) ; Recorder:   Ace, RN, Maricela Bo; Confirmation:   Confirmed ; Classification:   Patient Stated ; Code:   41324401 ; Contributor System:   PowerChart ; Last Updated:   01/18/2019 8:24 EST ; Life Cycle Date:   01/18/2019 ; Life Cycle Status:   Active ; Vocabulary:   SNOMED CT          Diagnoses(Active)    Urinary frequency  Date:   03/02/2019 ; Diagnosis Type:   Reason For Visit ;  Confirmation:   Complaint of ; Clinical Dx:   Urinary frequency ; Classification:   Medical ; Clinical Service:   Emergency medicine ; Code:   PNED ; Probability:   0 ; Diagnosis Code:   [id number]      Urination painful  Date:   03/02/2019 ; Diagnosis Type:   Reason For Visit ; Confirmation:   Complaint of ; Clinical Dx:   Urination painful ; Classification:   Medical ; Clinical Service:   Emergency medicine ; Code:   PNED ; Probability:   0 ; Diagnosis Code:   [id number]             -    Procedure History   (As Of: 03/02/2019 13:30:59 EST)     Anesthesia Minutes:   0 ; Procedure Name:   Hernia repair ; Procedure Minutes:   0            Anesthesia Minutes:   0 ; Procedure Name:   Tonsils and  adenoids ; Procedure Minutes:   0            [facility] Fall Risk Assessment Tool   Hx of falling last 3 months ED Fall :   No   Patient confused or disoriented ED Fall :   No   Patient intoxicated or sedated ED Fall :   No   Patient impaired gait ED Fall :   No   Use a mobility assistance device ED Fall :   No   Patient altered elimination ED Fall :   No   [facility] ED Fall Score :   0    Blair Promise - 03/02/2019 13:30 EST   ED Advance Directive   Advance Directive :   No   Blair Promise - 03/02/2019 13:30 EST   Social History   Social History   (As Of: 03/02/2019 13:30:59 EST)   Tobacco:        Tobacco use: Former smoker, quit more than 30 days ago.   Comments:  01/18/2019 8:26 - Ace, RN, Anderson Malta: some  chewing   (Last Updated: 01/18/2019 08:26:43 EST by Ace, RN, Anderson Malta)          Alcohol:        Current, Beer, 3-5 times per week   (Last Updated: 01/18/2019 08:27:11 EST by Ace, RN, Anderson Malta)          Substance Use:        Denies   (Last Updated: 01/18/2019 08:27:16 EST by Ace, RN, Anderson Malta)            Med Hx   Medication List   (As Of: 03/02/2019 13:30:59 EST)   Prescription/Discharge Order    hydrOXYzine  :   hydrOXYzine ; Status:   Prescribed ; Ordered  As Mnemonic:   hydrOXYzine hydrochloride 25 mg oral tablet ; Simple Display Line:   25 mg, 1 tabs, Oral, TID, for 7 days, 21 tabs, 0 Refill(s) ; Ordering Provider:   Jerrell Belfast; Catalog Code:   hydrOXYzine ; Order Dt/Tm:   02/25/2019 21:37:23 EST          methylPREDNISolone  :   methylPREDNISolone ; Status:   Prescribed ; Ordered As Mnemonic:   Medrol Dosepak 4 mg oral tablet ; Simple Display Line:   1 packets, Oral, Daily, for 6 days, as directed on package labeling, 21 tabs, 0 Refill(s) ; Ordering Provider:   Jerrell Belfast; Catalog Code:   methylPREDNISolone ; Order Dt/Tm:   02/25/2019 21:37:11 EST            Home Meds    fluticasone nasal  :   fluticasone nasal ; Status:   Documented ; Ordered As Mnemonic:   Flonase ; Simple Display Line:   Daily, 0 Refill(s) ; Catalog Code:   fluticasone nasal ; Order Dt/Tm:   01/18/2019 08:25:59 EST          omeprazole  :   omeprazole ; Status:   Documented ; Ordered As Mnemonic:   omeprazole ; Simple Display Line:   Oral, Daily, 0 Refill(s) ; Catalog Code:   omeprazole ; Order Dt/Tm:   01/18/2019 08:25:54 EST

## 2019-03-02 NOTE — ED Provider Notes (Signed)
Urinary frequency        Patient:   [name], [name]            MRN: [id number]            FIN: [id number]               Age:   38 years     Sex:  Male     DOB:  Jul 05, 1980   Associated Diagnoses:   Cystitis   Author:   Rulon Sera      Basic Information   Time seen: Provider Seen (ST)   ED Provider/Time:    Lonzell Dorris-MD,  Mica Ramdass H / 03/02/2019 13:15  .   Additional information: Chief Complaint from Nursing Triage Note   Chief Complaint  Chief Complaint: here xmas eve, got steroid hives have not improved, 2 days ago at end of urination burning sensation, now burning and frequency. lower ab pain (03/02/19 12:24:00).      History of Present Illness   38 year old male presents with some off-and-on and increasingly more severe dysuria for the last several days along with a suprapubic discomfort that started today.  No fevers, lightheadedness or vomiting.  No testicular pain or swelling.  No preceding flank pains.  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  Since he started the steroids, he has been having increasing dyspepsia which is manageable with antacids.  No vomiting, diarrhea or melena.  Patient is married and reports 10 years of monogamy.  No penile discharge.  No other new medications other than that described above.      Review of Systems             Additional review of systems information: All other systems reviewed and otherwise negative.      Health Status   Allergies:    Allergic Reactions (All)  Moderate  Sulfa drugs- Rash..   Medications:  (Selected)   Documented Medications  Documented  Flonase: Daily, 0 Refill(s)  omeprazole: Oral, Daily, 0 Refill(s).      Past Medical/ Family/ Social History   Surgical history: Reviewed as documented in chart.   Family history: Reviewed as documented in chart.   Social history: Reviewed as documented in chart.   Problem list: Per nurse's notes.       Physical Examination               Vital Signs   Vital Signs   03/02/2019 12:24 EST Systolic Blood Pressure 154 mmHg  HI    Diastolic Blood Pressure 95 mmHg  HI    Temperature Oral 36.8 degC    Heart Rate Monitored 97 bpm    Respiratory Rate 14 br/min    SpO2 96 %   .   Measurements   03/02/2019 12:30 EST Body Mass Index est meas 33.01 kg/m2   03/02/2019 12:30 EST Body Mass Index Measured 33.01 kg/m2   03/02/2019 12:24 EST Height/Length Measured 178 cm    Weight Dosing 104.6 kg   .   Basic Oxygen Information   03/02/2019 12:24 EST Oxygen Therapy Room air    SpO2 96 %   .   Vital signs noted above mild hypertension  Gen.: Alert, no signs of toxicity  HEENT: Pupils equal and reactive, extraocular movements intact, no scleral icterus, mucous membranes moist, oropharynx clear  Neck: Supple, no nuchal  rigidity, no thyromegaly  Chest: No tenderness or crepitus  Lungs: Clear to auscultation, no distress, oxygen saturation normal  Heart: Regular rate and rhythm, no audible murmur, no carotid bruit, no jugular venous distention  Abdomen: Soft, nondistended, nontender, no palpable masses.  GU: Genitalia normal appearance.  No hernia.  No scrotal edema or testicular pain to palpation.  No testicular mass.  Skin: Warm, dry and no rashes or icterus  Musculoskeletal: No swelling or deformity. No tenderness to palpation. Symmetric peripheral pulses. No edema.  Neurologic: Alert, normal gait, no focal motor or sensory deficits      Medical Decision Making   Results review:  Lab results : Lab View   03/02/2019 13:26 EST UA Color Straw    UA Appear Clear    UA Glucose Negative    UA Bili Negative    UA Ketones Negative    UA Spec Grav <=1.005    UA Blood Negative    UA pH 6.5    Protein U Negative    UA Urobilinogen 0.2 EU/dL    UA Nitrite Negative    UA Leuk Est Negative   03/02/2019 12:33 EST UA Color Yellow    UA Appear Clear    UA Glucose Negative    UA Bili Negative    UA Ketones Negative    UA Spec Grav <=1.005    UA Blood  Negative    UA pH 6.0    Protein U Negative    UA Urobilinogen 0.2 EU/dL    UA Nitrite Negative    UA Leuk Est Negative   03/02/2019 12:30 EST Estimated Creatinine Clearance 121.48 mL/min   .      Reexamination/ Reevaluation   03/02/2019 14:00:00: CT result noted above.  Considering the patient's reported recent history of diverticulitis along with his nonspecific urinary symptoms, I feel empiric antibiotics are indicated.  He is afebrile with a generally benign abdomen.  I see no need for blood work at this time.  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  Patient will be placed on a short course of Augmentin.  Recommend close follow-up with his primary physician.  Should he have fevers, worsening pain, intractable vomiting or other perceived worsening of his condition, he is encouraged to return to the ER.  Empiric treatment for urethritis with help pending GC chlamydia testing.      Impression and Plan   Diagnosis   Cystitis (ICD10-CM N30.90, Discharge, Medical)   Plan   Condition: Stable.    Disposition: Discharged: to home.    Prescriptions: Launch prescriptions   Pharmacy:  Augmentin 875 mg-125 mg oral tablet (Prescribe): 1 tabs, Oral, BID, for 7 days, 14 tabs, 0 Refill(s).    Patient was given the following educational materials: Dysuria, Diverticulitis.    Follow up with: Follow up with primary care provider Within 3 to 5 days Return to ED if symptoms worsen.    Counseled: Patient, Regarding diagnosis, Regarding diagnostic results, Regarding treatment plan, Regarding prescription, Patient indicated understanding of instructions.    Signature Line     Electronically Signed on 03/02/2019 02:02 PM EST   ________________________________________________   Jacqualyn PoseyESCARZA-MD,  Theadora Noyes H               Modified by: Rulon SeraESCARZA-MD,  Kagan Hietpas H on 03/02/2019 02:02 PM EST

## 2019-03-02 NOTE — ED Notes (Signed)
ED Triage Note       ED Triage Adult Entered On:  03/02/2019 12:30 EST    Performed On:  03/02/2019 12:24 EST by Ace, RN, Anderson Malta               Triage   Chief Complaint :   here xmas eve, got steroid hives have not improved, 2 days ago at end of urination burning sensation, now burning and frequency. lower ab pain    Numeric Rating Pain Scale :   3   [place] Mode of Arrival :   Private vehicle   Infectious Disease Documentation :   Document assessment   Temperature Oral :   36.8 degC(Converted to: 98.2 degF)    Heart Rate Monitored :   97 bpm   Respiratory Rate :   14 br/min   Systolic Blood Pressure :   154 mmHg (HI)    Diastolic Blood Pressure :   95 mmHg (HI)    SpO2 :   96 %   Oxygen Therapy :   Room air   Patient presentation :   None of the above   Chief Complaint or Presentation suggest infection :   Yes   Weight Dosing :   104.6 kg(Converted to: 230 lb 10 oz)    Height :   178 cm(Converted to: 5 ft 10 in)    Body Mass Index Dosing :   33 kg/m2   Ace, RN, Anderson Malta - 03/02/2019 12:24 EST   DCP GENERIC CODE   Tracking Acuity :   3   Tracking Group :   ED [employer], RN, Anderson Malta - 03/02/2019 12:24 EST   ED General Section :   Document assessment   Pregnancy Status :   N/A   ED Allergies Section :   Document assessment   ED Reason for Visit Section :   Document assessment   Ace, RN, Anderson Malta - 03/02/2019 12:24 EST   ID Risk Screen Symptoms   Recent Travel History :   No recent travel   Close Contact with COVID-19 ID :   No   Last 14 days COVID-19 ID :   No   TB Symptom Screen :   No symptoms   C. diff Symptom/History ID :   Neither of the above   Ace, RN, Anderson Malta - 03/02/2019 12:24 EST   Allergies   (As Of: 03/02/2019 12:30:23 EST)   Allergies (Active)   sulfa drugs  Estimated Onset Date:   Unspecified ; Reactions:   rash ; Created By:   Donivan Scull RN, Anderson Malta; Reaction Status:   Active ; Category:   Drug ; Substance:   sulfa drugs ; Type:   Allergy ; Severity:   Moderate ; Updated By:    Donivan Scull RN, Anderson Malta; Reviewed Date:   03/02/2019 12:27 EST        Psycho-Social   Last 3 mo, thoughts killing self/others :   Patient denies   Feels Unsafe at Home :   No   ED Behavioral Activity Rating Scale :   4 - Quiet and awake (normal level of activity)   Ace, RN, Anderson Malta - 03/02/2019 12:24 EST   ED Reason for Visit   (As Of: 03/02/2019 12:30:23 EST)   Problems(Active)    Diverticulitis (SNOMED CT  :045409811 )  Name of Problem:   Diverticulitis ; Recorder:   Ace, RN, Anderson Malta; Confirmation:   Confirmed ;  Classification:   Patient Stated ; Code:   [id number] ; Contributor System:   Dietitian ; Last Updated:   01/18/2019 8:24 EST ; Life Cycle Date:   01/18/2019 ; Life Cycle Status:   Active ; Vocabulary:   SNOMED CT        Ex-smoker (SNOMED CT  :55732202 )  Name of Problem:   Ex-smoker ; Recorder:   Ace, RN, Anderson Malta; Confirmation:   Confirmed ; Classification:   Patient Stated ; Code:   [id number] ; Contributor System:   PowerChart ; Last Updated:   01/18/2019 8:25 EST ; Life Cycle Date:   01/18/2019 ; Life Cycle Status:   Active ; Vocabulary:   SNOMED CT        SVT (supraventricular tachycardia) (SNOMED CT  :76283151 )  Name of Problem:   SVT (supraventricular tachycardia) ; Recorder:   Ace, RN, Anderson Malta; Confirmation:   Confirmed ; Classification:   Patient Stated ; Code:   [id number] ; Contributor System:   PowerChart ; Last Updated:   01/18/2019 8:24 EST ; Life Cycle Date:   01/18/2019 ; Life Cycle Status:   Active ; Vocabulary:   SNOMED CT          Diagnoses(Active)    Urinary frequency  Date:   03/02/2019 ; Diagnosis Type:   Reason For Visit ; Confirmation:   Complaint of ; Clinical Dx:   Urinary frequency ; Classification:   Medical ; Clinical Service:   Emergency medicine ; Code:   PNED ; Probability:   0 ; Diagnosis Code:   [id number]      Urination painful  Date:   03/02/2019 ; Diagnosis Type:   Reason For Visit ; Confirmation:   Complaint of ; Clinical Dx:   Urination  painful ; Classification:   Medical ; Clinical Service:   Emergency medicine ; Code:   PNED ; Probability:   0 ; Diagnosis Code:   [id number]

## 2019-03-02 NOTE — ED Notes (Signed)
ED Patient Education Note     Patient Education Materials Follows:  Emergency Medicine     Diverticulitis    Diverticulitis is inflammation or infection of small pouches in your colon that form when you have a condition called diverticulosis. The pouches in your colon are called diverticula. Your colon, or large intestine, is where water is absorbed and stool is formed.    Complications of diverticulitis can include:     Bleeding.     Severe infection.     Severe pain.     Perforation of your colon.     Obstruction of your colon.      CAUSES    Diverticulitis is caused by bacteria.    Diverticulitis happens when stool becomes trapped in diverticula. This allows bacteria to grow in the diverticula, which can lead to inflammation and infection.    RISK FACTORS    People with diverticulosis are at risk for diverticulitis. Eating a diet that does not include enough fiber from fruits and vegetables may make diverticulitis more likely to develop.    SYMPTOMS    Symptoms of diverticulitis may include:     Abdominal pain and tenderness. The pain is normally located on the left side of the abdomen, but may occur in other areas.     Fever and chills.     Bloating.     Cramping.      Nausea.     Vomiting.      Constipation.     Diarrhea.      Blood in your stool.    DIAGNOSIS    Your health care provider will ask you about your medical history and do a physical exam. You may need to have tests done because many medical conditions can cause the same symptoms as diverticulitis. Tests may include:     Blood tests.     Urine tests.     Imaging tests of the abdomen, including X-rays and CT scans.    When your condition is under control, your health care provider may recommend that you have a colonoscopy. A colonoscopy can show how severe your diverticula are and whether something else is causing your symptoms.    TREATMENT    Most cases of diverticulitis are mild and can be treated at home. Treatment may include:     Taking  over-the-counter pain medicines.     Following a clear liquid diet.     Taking antibiotic medicines by mouth for 7?10 days.    More severe cases may be treated at a hospital. Treatment may include:     Not eating or drinking.     Taking prescription pain medicine.     Receiving antibiotic medicines through an IV tube.     Receiving fluids and nutrition through an IV tube.     Surgery.    HOME CARE INSTRUCTIONS     Follow your health care provider's instructions carefully.     Follow a full liquid diet or other diet as directed by your health care provider. After your symptoms improve, your health care provider may tell you to change your diet. He or she may recommend you eat a high-fiber diet. Fruits and vegetables are good sources of fiber. Fiber makes it easier to pass stool.     Take fiber supplements or probiotics as directed by your health care provider.     Only take medicines as directed by your health care provider.      Keep all your   follow-up appointments.    SEEK MEDICAL CARE IF:     Your pain does not improve.     You have a hard time eating food.     Your bowel movements do not return to normal.    SEEK IMMEDIATE MEDICAL CARE IF:     Your pain becomes worse.     Your symptoms do not get better.     Your symptoms suddenly get worse.      You have a fever.     You have repeated vomiting.     You have bloody or black, tarry stools.    MAKE SURE YOU:     Understand these instructions.     Will watch your condition.     Will get help right away if you are not doing well or get worse.    This information is not intended to replace advice given to you by your health care provider. Make sure you discuss any questions you have with your health care provider.    Document Released: 11/28/2004 Document Revised: 02/23/2013 Document Reviewed: 01/13/2013  Elsevier Interactive Patient Education ?2016 [employer]      Obstetrics and Gynecology     Dysuria    Dysuria is pain or discomfort while urinating. The pain or  discomfort may be felt in the tube that carries urine out of the bladder (urethra) or in the surrounding tissue of the genitals. The pain may also be felt in the groin area, lower abdomen, and lower back. You may have to urinate frequently or have the sudden feeling that you have to urinate (urgency). Dysuria can affect both men and women, but is more common in women.    Dysuria can be caused by many different things, including:     Urinary tract infection in women.     Infection of the kidney or bladder.     Kidney stones or bladder stones.     Certain sexually transmitted infections (STIs), such as chlamydia.     Dehydration.     Inflammation of the vagina.     Use of certain medicines.     Use of certain soaps or scented products that cause irritation.    HOME CARE INSTRUCTIONS    Watch your dysuria for any changes. The following actions may help to reduce any discomfort you are feeling:     Drink enough fluid to keep your urine clear or pale yellow.     Empty your bladder often. Avoid holding urine for long periods of time.     After a bowel movement or urination, women should cleanse from front to back, using each tissue only once.     Empty your bladder after sexual intercourse.     Take medicines only as directed by your health care provider.     If you were prescribed an antibiotic medicine, finish it all even if you start to feel better.     Avoid caffeine, tea, and alcohol. They can irritate the bladder and make dysuria worse. In men, alcohol may irritate the prostate.     Keep all follow-up visits as directed by your health care provider. This is important.     If you had any tests done to find the cause of dysuria, it is your responsibility to obtain your test results. Ask the lab or department performing the test when and how you will get your results. Talk with your health care provider if you have any questions about your results.  SEEK MEDICAL CARE IF:     You develop pain in your back or sides.      You have a fever.     You have nausea or vomiting.     You have blood in your urine.     You are not urinating as often as you usually do.    SEEK IMMEDIATE MEDICAL CARE IF:     You pain is severe and not relieved with medicines.     You are unable to hold down any fluids.     You or someone else notices a change in your mental function.     You have a rapid heartbeat at rest.     You have shaking or chills.     You feel extremely weak.    This information is not intended to replace advice given to you by your health care provider. Make sure you discuss any questions you have with your health care provider.    Document Released: 11/17/2003 Document Revised: 03/11/2014 Document Reviewed: 10/14/2013  Elsevier Interactive Patient Education ?2016 [employer]

## 2019-03-02 NOTE — Discharge Summary (Signed)
ED Clinical Summary                         [facility]  [address]  [place], [place] [address]  [phone]           PERSON INFORMATION  Name: [name] Age:  38 Years DOB: 1981/01/17   Sex: Male Language: English PCP: Berna Spare   Marital Status:  Single Phone: [phone] Med Service: MED-Medicine   MRN:  [id number] Acct# [id number] Arrival: 03/02/2019 12:14:00   Visit Reason: Urination painful; Urinary frequency; PAINFUL URINATION/ABD PAIN Acuity: 3 LOS: 000 01:56   Address:      [address] [place] [address]  Diagnosis:      Cystitis  Printed Prescriptions:            Allergies      sulfa drugs (rash)      Medications Administered During Visit:        Patient Medication List:              amoxicillin-clavulanate (Augmentin 875 mg-125 mg oral tablet) 1 Tabs Oral (given by mouth) 2 times a day for 7 Days. Refills: 0.  fluticasone nasal (Flonase) every day.  hydrOXYzine (hydrOXYzine hydrochloride 25 mg oral tablet) 1 Tabs Oral (given by mouth) 3 times a day for 7 Days. Refills: 0.  methylPREDNISolone (Medrol Dosepak 4 mg oral tablet) 1 Packets Oral (given by mouth) every day for 6 Days. as directed on package labeling. Refills: 0.  omeprazole Oral (given by mouth) every day.         Major Tests and Procedures:  The following procedures and tests were performed during your ED visit.  COMMONPROCEDURES%>  COMMON PROCEDURESCOMMENTS%>          Laboratory Orders  Name Status Details   C Urine Ordered Urine, Catheterized, Stat, ST - Stat, 03/02/19 12:31:00 EST, 03/02/19 12:31:00 EST, Nurse collect   CT GC Urine Ordered Urine, Stat, ST - Stat, 03/02/19 13:15:00 EST, 03/02/19 13:15:00 EST, Nurse collect   UA Rflx Micro Completed Urine, Clean Catch, Stat, ST - Stat, 03/02/19 13:15:00 EST, Once, 03/02/19 13:15:00 EST, Nurse collect   UA w Mic Completed Urine, Clean Catch, Stat, ST - Stat, 03/02/19 12:31:00 EST, 03/02/19 12:31:00 EST, Nurse collect                Radiology Orders  Name Status Details   CT Abdomen Pelvis w/o Contrast Completed 03/02/19 13:27:00 EST, STAT 1 hour or less, Reason: Flank pain, stone disease suspected, Stone Protocol, Transport Mode: STRETCHER, pp_set_radiology_subspecialty               Patient Care Orders  Name Status Details   Discharge Patient Ordered 03/02/19 14:03:00 EST   ED Assessment Adult Completed 03/02/19 12:30:23 EST, 03/02/19 12:30:23 EST   ED Secondary Triage Completed 03/02/19 12:30:23 EST, 03/02/19 12:30:23 EST   ED Triage Adult Completed 03/02/19 12:14:29 EST, 03/02/19 12:14:29 EST             PROVIDER INFORMATION               Provider Role Assigned Huntley Dec ED Provider 03/02/2019 13:07:55 03/02/2019 13:08:23   Blair Promise ED Nurse 03/02/2019 13:10:56    Rulon Sera ED Provider 03/02/2019 13:15:03        Attending Physician:  Rulon Sera     Admit Doc  ESCARZA-MD,  ROBERT H     Consulting Doc       VITALS INFORMATION  Vital Sign Triage Latest   Temp Oral ORAL_1%>36.8 degC ORAL%>36.8 degC   Temp Temporal TEMPORAL_1%> TEMPORAL%>   Temp Intravascular INTRAVASCULAR_1%> INTRAVASCULAR%>   Temp Axillary AXILLARY_1%> AXILLARY%>   Temp Rectal RECTAL_1%> RECTAL%>   02 Sat 96 % 96 %   Respiratory Rate RATE_1%>14 br/min RATE%>14 br/min   Peripheral Pulse Rate PULSE RATE_1%> PULSE RATE%>   Apical Heart Rate HEART RATE_1%> HEART RATE%>   Blood Pressure BLOOD PRESSURE_1%>/ BLOOD PRESSURE_1%>95 mmHg BLOOD PRESSURE%>154 mmHg / BLOOD PRESSURE%>95 mmHg                 Immunizations      No Immunizations Documented This Visit          DISCHARGE INFORMATION   Discharge Disposition: H Outpt-Sent Home   Discharge Location:    Home   Discharge Date and Time:    03/02/2019 14:10:20   ED Checkout Date and Time:    03/02/2019 14:10:20     DEPART REASON INCOMPLETE INFORMATION               Depart Action Incomplete Reason   Interactive View/I&O Recently assessed               Problems      No  Problems Documented              Smoking Status      Former smoker, quit more than 30 days ago         PATIENT EDUCATION INFORMATION  Instructions:       Diverticulitis; Dysuria     Follow up:                    With: Address: When:   Follow up with primary care provider  Within 3 to 5 days   Comments:   Return to ED if symptoms worsen           ED PROVIDER DOCUMENTATION     Patient:   [name], [name]            MRN: [id number]            FIN: [id number]               Age:   77 years     Sex:  Male     DOB:  02/01/1981   Associated Diagnoses:   Cystitis   Author:   Rulon Sera      Basic Information   Time seen: Provider Seen (ST)   ED Provider/Time:    ESCARZA-MD,  ROBERT H / 03/02/2019 13:15  .   Additional information: Chief Complaint from Nursing Triage Note   Chief Complaint  Chief Complaint: here xmas eve, got steroid hives have not improved, 2 days ago at end of urination burning sensation, now burning and frequency. lower ab pain (03/02/19 12:24:00).      History of Present Illness   38 year old male presents with some off-and-on and increasingly more severe dysuria for the last several days along with a suprapubic discomfort that started today.  No fevers, lightheadedness or vomiting.  No testicular pain or swelling.  No preceding flank pains.  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  Since he started the steroids,  ED Clinical Summary                         [facility]  [address]  [place], [place] [address]  [phone]           PERSON INFORMATION  Name: [name] Age:  38 Years DOB: 1981/01/17   Sex: Male Language: English PCP: Berna Spare   Marital Status:  Single Phone: [phone] Med Service: MED-Medicine   MRN:  [id number] Acct# [id number] Arrival: 03/02/2019 12:14:00   Visit Reason: Urination painful; Urinary frequency; PAINFUL URINATION/ABD PAIN Acuity: 3 LOS: 000 01:56   Address:      [address] [place] [address]  Diagnosis:      Cystitis  Printed Prescriptions:            Allergies      sulfa drugs (rash)      Medications Administered During Visit:        Patient Medication List:              amoxicillin-clavulanate (Augmentin 875 mg-125 mg oral tablet) 1 Tabs Oral (given by mouth) 2 times a day for 7 Days. Refills: 0.  fluticasone nasal (Flonase) every day.  hydrOXYzine (hydrOXYzine hydrochloride 25 mg oral tablet) 1 Tabs Oral (given by mouth) 3 times a day for 7 Days. Refills: 0.  methylPREDNISolone (Medrol Dosepak 4 mg oral tablet) 1 Packets Oral (given by mouth) every day for 6 Days. as directed on package labeling. Refills: 0.  omeprazole Oral (given by mouth) every day.         Major Tests and Procedures:  The following procedures and tests were performed during your ED visit.  COMMONPROCEDURES%>  COMMON PROCEDURESCOMMENTS%>          Laboratory Orders  Name Status Details   C Urine Ordered Urine, Catheterized, Stat, ST - Stat, 03/02/19 12:31:00 EST, 03/02/19 12:31:00 EST, Nurse collect   CT GC Urine Ordered Urine, Stat, ST - Stat, 03/02/19 13:15:00 EST, 03/02/19 13:15:00 EST, Nurse collect   UA Rflx Micro Completed Urine, Clean Catch, Stat, ST - Stat, 03/02/19 13:15:00 EST, Once, 03/02/19 13:15:00 EST, Nurse collect   UA w Mic Completed Urine, Clean Catch, Stat, ST - Stat, 03/02/19 12:31:00 EST, 03/02/19 12:31:00 EST, Nurse collect                Radiology Orders  Name Status Details   CT Abdomen Pelvis w/o Contrast Completed 03/02/19 13:27:00 EST, STAT 1 hour or less, Reason: Flank pain, stone disease suspected, Stone Protocol, Transport Mode: STRETCHER, pp_set_radiology_subspecialty               Patient Care Orders  Name Status Details   Discharge Patient Ordered 03/02/19 14:03:00 EST   ED Assessment Adult Completed 03/02/19 12:30:23 EST, 03/02/19 12:30:23 EST   ED Secondary Triage Completed 03/02/19 12:30:23 EST, 03/02/19 12:30:23 EST   ED Triage Adult Completed 03/02/19 12:14:29 EST, 03/02/19 12:14:29 EST             PROVIDER INFORMATION               Provider Role Assigned Huntley Dec ED Provider 03/02/2019 13:07:55 03/02/2019 13:08:23   Blair Promise ED Nurse 03/02/2019 13:10:56    Rulon Sera ED Provider 03/02/2019 13:15:03        Attending Physician:  Rulon Sera     Admit Doc  ED Clinical Summary                         [facility]  [address]  [place], [place] [address]  [phone]           PERSON INFORMATION  Name: [name] Age:  38 Years DOB: 1981/01/17   Sex: Male Language: English PCP: Berna Spare   Marital Status:  Single Phone: [phone] Med Service: MED-Medicine   MRN:  [id number] Acct# [id number] Arrival: 03/02/2019 12:14:00   Visit Reason: Urination painful; Urinary frequency; PAINFUL URINATION/ABD PAIN Acuity: 3 LOS: 000 01:56   Address:      [address] [place] [address]  Diagnosis:      Cystitis  Printed Prescriptions:            Allergies      sulfa drugs (rash)      Medications Administered During Visit:        Patient Medication List:              amoxicillin-clavulanate (Augmentin 875 mg-125 mg oral tablet) 1 Tabs Oral (given by mouth) 2 times a day for 7 Days. Refills: 0.  fluticasone nasal (Flonase) every day.  hydrOXYzine (hydrOXYzine hydrochloride 25 mg oral tablet) 1 Tabs Oral (given by mouth) 3 times a day for 7 Days. Refills: 0.  methylPREDNISolone (Medrol Dosepak 4 mg oral tablet) 1 Packets Oral (given by mouth) every day for 6 Days. as directed on package labeling. Refills: 0.  omeprazole Oral (given by mouth) every day.         Major Tests and Procedures:  The following procedures and tests were performed during your ED visit.  COMMONPROCEDURES%>  COMMON PROCEDURESCOMMENTS%>          Laboratory Orders  Name Status Details   C Urine Ordered Urine, Catheterized, Stat, ST - Stat, 03/02/19 12:31:00 EST, 03/02/19 12:31:00 EST, Nurse collect   CT GC Urine Ordered Urine, Stat, ST - Stat, 03/02/19 13:15:00 EST, 03/02/19 13:15:00 EST, Nurse collect   UA Rflx Micro Completed Urine, Clean Catch, Stat, ST - Stat, 03/02/19 13:15:00 EST, Once, 03/02/19 13:15:00 EST, Nurse collect   UA w Mic Completed Urine, Clean Catch, Stat, ST - Stat, 03/02/19 12:31:00 EST, 03/02/19 12:31:00 EST, Nurse collect                Radiology Orders  Name Status Details   CT Abdomen Pelvis w/o Contrast Completed 03/02/19 13:27:00 EST, STAT 1 hour or less, Reason: Flank pain, stone disease suspected, Stone Protocol, Transport Mode: STRETCHER, pp_set_radiology_subspecialty               Patient Care Orders  Name Status Details   Discharge Patient Ordered 03/02/19 14:03:00 EST   ED Assessment Adult Completed 03/02/19 12:30:23 EST, 03/02/19 12:30:23 EST   ED Secondary Triage Completed 03/02/19 12:30:23 EST, 03/02/19 12:30:23 EST   ED Triage Adult Completed 03/02/19 12:14:29 EST, 03/02/19 12:14:29 EST             PROVIDER INFORMATION               Provider Role Assigned Huntley Dec ED Provider 03/02/2019 13:07:55 03/02/2019 13:08:23   Blair Promise ED Nurse 03/02/2019 13:10:56    Rulon Sera ED Provider 03/02/2019 13:15:03        Attending Physician:  Rulon Sera     Admit Doc

## 2019-03-02 NOTE — ED Notes (Signed)
 ED Patient Summary       ;          [facility]  [address] [place], [place] [address]  [phone]  Discharge Instructions (Patient)  _______________________________________     Name:[name], [name]  DOB:  [birth year]                   MRN: [id number]                   FIN: [id number]  Reason For Visit: Urination painful; Urinary frequency; PAINFUL URINATION/ABD PAIN  Final Diagnosis: Cystitis     Visit Date: 03/02/2019 12:14:00  Address: [address] [place] [address]  Phone: [phone]     Emergency Department Providers:         Primary Physician:   [facility] would like to thank you for allowing us  to assist you with your healthcare needs. The following includes patient education materials and information regarding your injury/illness.     Follow-up Instructions:  You were seen today on an emergency basis. Please contact your primary care doctor for a follow up appointment. If you received a referral to a specialist doctor, it is important you follow-up as instructed.    It is important that you call your follow-up doctor to schedule and confirm the location of your next appointment. Your doctor may practice at multiple locations. The office location of your follow-up appointment may be different to the one written on your discharge instructions.    If you do not have a primary care doctor, please call [phone] for help in finding a [name] Provider. For help in finding a specialist doctor, please call [phone].    The [facility] "Ask a Nurse" line in staffed by Registered Nurses and is a free service to the community. We are available Monday - Friday from 8am to 5pm to answer your questions about your health. Please call [phone].    If your condition gets worse before your follow-up with your primary care doctor or specialist, please return to the Emergency  Department.        Follow Up Appointments:  Primary Care Provider:      Name: [name]      Phone: [phone]                 With: Address: When:   Follow up with primary care provider  Within 3 to 5 days   Comments:   Return to ED if symptoms worsen              Printed Prescriptions:    Patient Education Materials:  Discharge Orders          Discharge Patient 03/02/19 14:03:00 EST         Comment:      Diverticulitis; Dysuria     Diverticulitis    Diverticulitis is inflammation or infection of small pouches in your colon that form when you have a condition called diverticulosis. The pouches in your colon are called diverticula. Your colon, or large intestine, is where water is absorbed and stool is formed.    Complications of diverticulitis can include:     Bleeding.     Severe infection.     Severe pain.     Perforation of your colon.     Obstruction  of your colon.      CAUSES    Diverticulitis is caused by bacteria.    Diverticulitis happens when stool becomes trapped in diverticula. This allows bacteria to grow in the diverticula, which can lead to inflammation and infection.    RISK FACTORS    People with diverticulosis are at risk for diverticulitis. Eating a diet that does not include enough fiber from fruits and vegetables may make diverticulitis more likely to develop.    SYMPTOMS    Symptoms of diverticulitis may include:     Abdominal pain and tenderness. The pain is normally located on the left side of the abdomen, but may occur in other areas.     Fever and chills.     Bloating.     Cramping.      Nausea.     Vomiting.      Constipation.     Diarrhea.      Blood in your stool.    DIAGNOSIS    Your health care provider will ask you about your medical history and do a physical exam. You may need to have tests done because many medical conditions can cause the same symptoms as diverticulitis. Tests may include:     Blood tests.     Urine tests.     Imaging tests of the abdomen, including  X-rays and CT scans.    When your condition is under control, your health care provider may recommend that you have a colonoscopy. A colonoscopy can show how severe your diverticula are and whether something else is causing your symptoms.    TREATMENT    Most cases of diverticulitis are mild and can be treated at home. Treatment may include:     Taking over-the-counter pain medicines.     Following a clear liquid diet.     Taking antibiotic medicines by mouth for 7?10 days.    More severe cases may be treated at a hospital. Treatment may include:     Not eating or drinking.     Taking prescription pain medicine.     Receiving antibiotic medicines through an IV tube.     Receiving fluids and nutrition through an IV tube.     Surgery.    HOME CARE INSTRUCTIONS     Follow your health care provider's instructions carefully.     Follow a full liquid diet or other diet as directed by your health care provider. After your symptoms improve, your health care provider may tell you to change your diet. He or she may recommend you eat a high-fiber diet. Fruits and vegetables are good sources of fiber. Fiber makes it easier to pass stool.     Take fiber supplements or probiotics as directed by your health care provider.     Only take medicines as directed by your health care provider.      Keep all your follow-up appointments.    SEEK MEDICAL CARE IF:     Your pain does not improve.     You have a hard time eating food.     Your bowel movements do not return to normal.    SEEK IMMEDIATE MEDICAL CARE IF:     Your pain becomes worse.     Your symptoms do not get better.     Your symptoms suddenly get worse.      You have a fever.     You have repeated vomiting.     You have bloody or black, tarry  stools.    MAKE SURE YOU:     Understand these instructions.     Will watch your condition.     Will get help right away if you are not doing well or get worse.    This information is not intended to replace advice given to you by your  health care provider. Make sure you discuss any questions you have with your health care provider.    Document Released: 11/28/2004 Document Revised: 02/23/2013 Document Reviewed: 01/13/2013  Elsevier Interactive Patient Education ?2016 Elsevier Inc.       Dysuria    Dysuria is pain or discomfort while urinating. The pain or discomfort may be felt in the tube that carries urine out of the bladder (urethra) or in the surrounding tissue of the genitals. The pain may also be felt in the groin area, lower abdomen, and lower back. You may have to urinate frequently or have the sudden feeling that you have to urinate (urgency). Dysuria can affect both men and women, but is more common in women.    Dysuria can be caused by many different things, including:     Urinary tract infection in women.     Infection of the kidney or bladder.     Kidney stones or bladder stones.     Certain sexually transmitted infections (STIs), such as chlamydia.     Dehydration.     Inflammation of the vagina.     Use of certain medicines.     Use of certain soaps or scented products that cause irritation.    HOME CARE INSTRUCTIONS    Watch your dysuria for any changes. The following actions may help to reduce any discomfort you are feeling:     Drink enough fluid to keep your urine clear or pale yellow.     Empty your bladder often. Avoid holding urine for long periods of time.     After a bowel movement or urination, women should cleanse from front to back, using each tissue only once.     Empty your bladder after sexual intercourse.     Take medicines only as directed by your health care provider.     If you were prescribed an antibiotic medicine, finish it all even if you start to feel better.     Avoid caffeine, tea, and alcohol. They can irritate the bladder and make dysuria worse. In men, alcohol may irritate the prostate.     Keep all follow-up visits as directed by your health care provider. This is important.     If you had any tests  done to find the cause of dysuria, it is your responsibility to obtain your test results. Ask the lab or department performing the test when and how you will get your results. Talk with your health care provider if you have any questions about your results.    SEEK MEDICAL CARE IF:     You develop pain in your back or sides.     You have a fever.     You have nausea or vomiting.     You have blood in your urine.     You are not urinating as often as you usually do.    SEEK IMMEDIATE MEDICAL CARE IF:     You pain is severe and not relieved with medicines.     You are unable to hold down any fluids.     You or someone else notices a change in your mental function.  You have a rapid heartbeat at rest.     You have shaking or chills.     You feel extremely weak.    This information is not intended to replace advice given to you by your health care provider. Make sure you discuss any questions you have with your health care provider.    Document Released: 11/17/2003 Document Revised: 03/11/2014 Document Reviewed: 10/14/2013  Elsevier Interactive Patient Education ?2016 Elsevier Inc.         Allergy Info: sulfa drugs     Medication Information:  Roberto Deitra Leech [facility] ED Physicians provided you with a complete list of medications post discharge, if you have been instructed to stop taking a medication please ensure you also follow up with this information to your Primary Care Physician.  Unless otherwise noted, patient will continue to take medications as prescribed prior to the Emergency Room visit.  Any specific questions regarding your chronic medications and dosages should be discussed with your physician(s) and pharmacist.          amoxicillin-clavulanate (Augmentin 875 mg-125 mg oral tablet) 1 Tabs Oral (given by mouth) 2 times a day for 7 Days. Refills: 0.  fluticasone nasal (Flonase) every day.  hydrOXYzine (hydrOXYzine hydrochloride 25 mg oral tablet) 1 Tabs Oral (given by mouth) 3 times a day  for 7 Days. Refills: 0.  methylPREDNISolone  (Medrol  Dosepak 4 mg oral tablet) 1 Packets Oral (given by mouth) every day for 6 Days. as directed on package labeling. Refills: 0.  omeprazole  Oral (given by mouth) every day.      Medications Administered During Visit:       Major Tests and Procedures:  The following procedures and tests were performed during your ED visit.  PROCEDURES%>  PROCEDURES COMMENTS%>          Laboratory Orders  Name Status Details   C Urine Ordered Urine, Catheterized, Stat, ST - Stat, 03/02/19 12:31:00 EST, 03/02/19 12:31:00 EST, Nurse collect   CT GC Urine Ordered Urine, Stat, ST - Stat, 03/02/19 13:15:00 EST, 03/02/19 13:15:00 EST, Nurse collect   UA Rflx Micro Completed Urine, Clean Catch, Stat, ST - Stat, 03/02/19 13:15:00 EST, Once, 03/02/19 13:15:00 EST, Nurse collect   UA w Mic Completed Urine, Clean Catch, Stat, ST - Stat, 03/02/19 12:31:00 EST, 03/02/19 12:31:00 EST, Nurse collect               Radiology Orders  Name Status Details   CT Abdomen Pelvis w/o Contrast Completed 03/02/19 13:27:00 EST, STAT 1 hour or less, Reason: Flank pain, stone disease suspected, Stone Protocol, Transport Mode: STRETCHER, pp_set_radiology_subspecialty               Patient Care Orders  Name Status Details   Discharge Patient Ordered 03/02/19 14:03:00 EST   ED Assessment Adult Completed 03/02/19 12:30:23 EST, 03/02/19 12:30:23 EST   ED Secondary Triage Completed 03/02/19 12:30:23 EST, 03/02/19 12:30:23 EST   ED Triage Adult Completed 03/02/19 12:14:29 EST, 03/02/19 12:14:29 EST       ---------------------------------------------------------------------------------------------------------------------  Roberto Shelvy [facility]) encourages you to self-enroll in the [facility] Patient Portal.  [facility] Patient Portal will allow you to manage your personal health information securely from your own electronic device now and in the future.  To begin your Patient Portal enrollment  process, please visit [email or web address] Click on "Sign up now" under [facility].  If you find that you need additional assistance on the [facility] Patient Portal or need a copy of  your medical records, please call the [facility] Medical Records Office at [phone].  Comment:

## 2019-03-03 LAB — CULTURE, URINE: FINAL REPORT: NO GROWTH

## 2019-03-03 LAB — C.TRACHOMATIS N.GONORRHOEAE DNA, URINE
Chlamydia Trachomatis, NAA, Urine: NEGATIVE
Neisseria gonorrhoeae, NAA, Urine: NEGATIVE

## 2020-06-29 LAB — POC URINALYSIS, CHEMISTRY
Bilirubin, Urine, POC: NEGATIVE
Glucose, UA POC: NEGATIVE mg/dL
Ketones, Urine, POC: NEGATIVE mg/dL
Leukocytes, UA: NEGATIVE
Nitrate, UA POC: NEGATIVE
Protein, Urine, POC: NEGATIVE
Specific Gravity, Urine, POC: 1.01 (ref 1.003–1.035)
UROBILIN U POC: 0.2 EU/dL
pH, Urine, POC: 6.5 (ref 4.5–8.0)

## 2020-06-29 LAB — COVID-19 & INFLUENZA COMBO
INFLUENZA A: DETECTED — AB
INFLUENZA B: NOT DETECTED
SARS-CoV-2: NOT DETECTED

## 2020-06-29 NOTE — Discharge Summary (Signed)
ED Clinical Summary                         [facility]  [address]  [place], [place], [address]  [phone]           PERSON INFORMATION  Name: [name], [name] Age:  40 Years DOB: [birth year]   Sex: Male Language: English PCP: Hezzie Bump M-MD   Marital Status:  Single Phone: [phone] Med Service: MED-Medicine   MRN:  [id number] Acct# [id number] Arrival: 06/29/2020 13:38:00   Visit Reason: Diarrhea; FEVER/N/V/D Acuity: 3 LOS: 000 02:29   Address:      [address] [place] [address]  Diagnosis:      Influenza A; Knee pain, left  Printed Prescriptions:            Allergies      sulfa drugs (rash)      Medications Administered During Visit:                  Medication Dose Route   ibuprofen 800 mg Oral       Patient Medication List:              fluticasone nasal (Flonase) every day.  omeprazole Oral (given by mouth) every day.  oseltamivir (Tamiflu 75 mg oral capsule) 1 Capsules Oral (given by mouth) 2 times a day for 5 Days. Refills: 0.         Major Tests and Procedures:  The following procedures and tests were performed during your ED visit.  COMMONPROCEDURES%>  COMMON PROCEDURESCOMMENTS%>          Laboratory Orders  Name Status Details   .UA POC Completed Urine, RT, RT - Routine, Collected, 06/29/20 15:17:00 EDT, Nurse collect, 06/29/20 15:17:00 US/Eastern, RAL POC Login   [facility] Completed Nasopharyngeal Swab, Stat, ST - Stat, 06/29/20 15:09:00 EDT, 06/29/20 15:09:00 EDT, Nurse collect, Marca Ancona LANE-PA, Print label Y/N               Radiology Orders  No radiology orders were placed.              Patient Care Orders  Name Status Details   COVID-19 Status Ordered 06/29/20 15:09:17 EDT, NOT VALID FOR pharmacy, laboratory, radiology., 06/29/20 15:09:17 EDT, COVID-19 Not Detected   DC ISO Order/Icons Ordered 06/29/20 15:46:56 EDT, 06/29/20 15:46:56 EDT   Discharge Patient Ordered 06/29/20 15:58:00 EDT   ED Assessment Adult Completed 06/29/20  14:10:05 EDT, 06/29/20 14:10:05 EDT   ED Secondary Triage Completed 06/29/20 14:10:05 EDT, 06/29/20 14:10:05 EDT   ED Triage Adult Completed 06/29/20 13:38:51 EDT, 06/29/20 13:38:51 EDT   Notify Provider Completed 06/29/20 15:09:17 EDT, This message can only be seen by Nursing, it is not visible to Pharmacy, Laboratory, or Radiology., 06/29/20 15:09:17 EDT   POC-Urine Dipstick collect Completed 06/29/20 15:09:00 EDT, Once, 06/29/20 15:09:00 EDT   Patient Isolation Ordered 06/29/20 15:09:00 EDT, Contact and Airborne, Constant Indicator             PROVIDER INFORMATION               Provider Role Assigned Knox [facility] ED MidLevel 06/29/2020 14:53:26    Leola Brazil N-RN ED Nurse 06/29/2020 14:55:49        Attending Physician:  Marca Ancona LANE-PA     Admit Doc  Marca Ancona LANE-PA  Consulting Doc       VITALS INFORMATION  Vital Sign Triage Latest   Temp Oral ORAL_1%>37.9 degC ORAL%>37.9 degC   Temp Temporal TEMPORAL_1%> TEMPORAL%>   Temp Intravascular INTRAVASCULAR_1%> INTRAVASCULAR%>   Temp Axillary AXILLARY_1%> AXILLARY%>   Temp Rectal RECTAL_1%> RECTAL%>   02 Sat 100 % 100 %   Respiratory Rate RATE_1%>16 br/min RATE%>16 br/min   Peripheral Pulse Rate PULSE RATE_1%> PULSE RATE%>   Apical Heart Rate HEART RATE_1%> HEART RATE%>   Blood Pressure BLOOD PRESSURE_1%>/ BLOOD PRESSURE_1%>87 mmHg BLOOD PRESSURE%>150 mmHg / BLOOD PRESSURE%>87 mmHg                 Immunizations      No Immunizations Documented This Visit          DISCHARGE INFORMATION   Discharge Disposition: H Outpt-Sent Home   Discharge Location:    Home   Discharge Date and Time:    06/29/2020 16:07:25   ED Checkout Date and Time:    06/29/2020 16:07:25     DEPART REASON INCOMPLETE INFORMATION               Depart Action Incomplete Reason   Interactive View/I&O Recently assessed               Problems      No Problems Documented              Smoking Status      Former smoker, quit more than 30 days ago         PATIENT EDUCATION  INFORMATION  Instructions:       Joint Pain; Influenza, Adult     Follow up:                    With: Address: When:   SC[facility]ER[facility] [address] [place] [address]  [phone] Business (1) Within 1 week   Comments:   Please follow-up with your primary care provider. Rest and stay hydrated. Tylenol or Motrin as needed for fever or discomfort. Follow-up with orthopedics regarding ongoing left knee pain.   Rest, ice, compression with Ace wrap, elevation   If you plan to take Tamiflu, recommend starting it today.   Return to the emergency department for any worsening or new concerning symptoms as discussed.              ED PROVIDER DOCUMENTATION

## 2020-06-29 NOTE — ED Notes (Signed)
ED Triage Note       ED Secondary Triage Entered On:  06/29/2020 14:53 EDT    Performed On:  06/29/2020 14:53 EDT by Leola Brazil N-RN               General Information   Barriers to Learning :   None evident   COVID-19 Vaccine Status :   2 Doses received   2 Doses Received Manufacturer :   Moderna vaccine   ED Home Meds Section :   Document assessment   [facility] ED Fall Risk Section :   Document assessment   ED Advance Directives Section :   Document assessment   ED Palliative Screen :   N/A (prefilled for <65yo)   Leola Brazil N-RN - 06/29/2020 14:53 EDT   (As Of: 06/29/2020 14:53:34 EDT)   Problems(Active)    Diverticulitis (SNOMED CT  :086578469 )  Name of Problem:   Diverticulitis ; Recorder:   Ace, RN, Anderson Malta; Confirmation:   Confirmed ; Classification:   Patient Stated ; Code:   [id number] ; Contributor System:   Dietitian ; Last Updated:   01/18/2019 8:24 EST ; Life Cycle Date:   01/18/2019 ; Life Cycle Status:   Active ; Vocabulary:   SNOMED CT        Ex-smoker (SNOMED CT  :24401027 )  Name of Problem:   Ex-smoker ; Recorder:   Ace, RN, Anderson Malta; Confirmation:   Confirmed ; Classification:   Patient Stated ; Code:   [id number] ; Contributor System:   PowerChart ; Last Updated:   01/18/2019 8:25 EST ; Life Cycle Date:   01/18/2019 ; Life Cycle Status:   Active ; Vocabulary:   SNOMED CT        SVT (supraventricular tachycardia) (SNOMED CT  :34742595 )  Name of Problem:   SVT (supraventricular tachycardia) ; Recorder:   Ace, RN, Anderson Malta; Confirmation:   Confirmed ; Classification:   Patient Stated ; Code:   [id number] ; Contributor System:   PowerChart ; Last Updated:   01/18/2019 8:24 EST ; Life Cycle Date:   01/18/2019 ; Life Cycle Status:   Active ; Vocabulary:   SNOMED CT          Diagnoses(Active)    Diarrhea  Date:   06/29/2020 ; Diagnosis Type:   Reason For Visit ; Confirmation:   Complaint of ; Clinical Dx:   Diarrhea ; Classification:   Medical ; Clinical Service:   Non-Specified ; Code:   PNED ;  Probability:   0 ; Diagnosis Code:   [id number]             -    Procedure History   (As Of: 06/29/2020 14:53:34 EDT)     Anesthesia Minutes:   0 ; Procedure Name:   Hernia repair ; Procedure Minutes:   0            Anesthesia Minutes:   0 ; Procedure Name:   Tonsils and adenoids ; Procedure Minutes:   0            [facility] Fall Risk Assessment Tool   Hx of falling last 3 months ED Fall :   No   Patient confused or disoriented ED Fall :   No   Patient intoxicated or sedated ED Fall :   No   Patient impaired gait ED Fall :   No   Use a mobility assistance device ED Fall :   No  Patient altered elimination ED Fall :   No   [facility] ED Fall Score :   0    Leola Brazil N-RN - 06/29/2020 14:53 EDT   ED Advance Directive   Advance Directive :   No   Leola Brazil N-RN - 06/29/2020 14:53 EDT   Med Hx   Medication List   (As Of: 06/29/2020 14:53:34 EDT)   Home Meds    fluticasone nasal  :   fluticasone nasal ; Status:   Documented ; Ordered As Mnemonic:   Flonase ; Simple Display Line:   Daily, 0 Refill(s) ; Catalog Code:   fluticasone nasal ; Order Dt/Tm:   01/18/2019 08:25:59 EST          omeprazole  :   omeprazole ; Status:   Documented ; Ordered As Mnemonic:   omeprazole ; Simple Display Line:   Oral, Daily, 0 Refill(s) ; Catalog Code:   omeprazole ; Order Dt/Tm:   01/18/2019 08:25:54 EST

## 2020-06-29 NOTE — ED Notes (Signed)
 ED Patient Summary       ;          [facility]  [address], [place], [place] [address]  [phone]  Discharge Instructions (Patient)  Name:[name]  DOB:  07/02/1980                   MRN: [id number]                   FIN: [id number]  Reason For Visit: Diarrhea; FEVER/N/V/D  Final Diagnosis: Influenza A; Knee pain, left     Visit Date: 06/29/2020 13:38:00  Address: [address] [place] [address]  Phone: [phone]     Emergency Department Providers:         Primary Physician:      JANNETTA CREDIT [facility] would like to thank you for allowing us  to assist you with your healthcare needs. The following includes patient education materials and information regarding your injury/illness.     Follow-up Instructions:  You were seen today on an emergency basis. Please contact your primary care doctor for a follow up appointment. If you received a referral to a specialist doctor, it is important you follow-up as instructed.    It is important that you call your follow-up doctor to schedule and confirm the location of your next appointment. Your doctor may practice at multiple locations. The office location of your follow-up appointment may be different to the one written on your discharge instructions.    If you do not have a primary care doctor, please call [phone] for help in finding a [name] Provider. For help in finding a specialist doctor, please call [phone].    If your condition gets worse before your follow-up with your primary care doctor or specialist, please return to the Emergency Department.      Coronavirus 2019 (COVID-19) Reminders:     Patients age 72 - 73, with parental consent, and over age 54 can make an appointment for a COVID-19 vaccine. Patients can contact their [facility] doctors' offices to schedule an appointment to receive the COVID-19 vaccine. Patients who do  not have a [facility] physician can call [phone] to schedule vaccination appointments.      Follow Up Appointments:  Primary Care Provider:     Name: CRICKMAN,  SARAH M-MD     Phone: [phone]                 With: Address: When:   [facility] [address] [place] [address]  [phone] Business (1) Within 1 week   Comments:   Please follow-up with your primary care provider. Rest and stay hydrated. Tylenol or Motrin as needed for fever or discomfort. Follow-up with orthopedics regarding ongoing left knee pain.   Rest, ice, compression with Ace wrap, elevation   If you plan to take Tamiflu, recommend starting it today.   Return to the emergency department for any worsening or new concerning symptoms as discussed.                 Post [facility] SERVICES%>             New Medications  Printed Prescriptions  oseltamivir (Tamiflu 75 mg oral capsule) 1 Capsules Oral (given by mouth) 2 times a day for 5 Days. Refills: 0.  Last Dose:____________________  Medications that have not changed  Other Medications  fluticasone nasal (Flonase) every day.  Last Dose:____________________  omeprazole  Oral (given by mouth) every day.  Last Dose:____________________      Allergy Info: sulfa drugs     Discharge Additional Information          Discharge Patient 06/29/20 15:58:00 EDT      Patient Education Materials:        Joint Pain      Joint pain may be caused by many things. Joint pain is likely to go away when you follow instructions from your health care provider for relieving pain at home. However, joint pain can also be caused by conditions that require more treatment. Common causes of joint pain include:   Bruising in the area of the joint.     Injury caused by repeating certain movements too many times.     Age-related joint wear and tear.     Buildup of uric acid crystals in the joint (gout).     Inflammation of the joint.     Other forms of arthritis.      Infections of the joint or of the bone.      Your health care provider may recommend that you take pain medicine or wear a supportive device like an elastic bandage, sling, or splint. If your joint pain continues, you may need lab or imaging tests to diagnose the cause of your joint pain.      Follow these instructions at home:    Managing pain, stiffness, and swelling         If directed, put ice on the painful area. To do this:  ? If you have a removable elastic bandage, sling, or splint, take it off as told by your doctor.    ? Put ice in a plastic bag.    ? Place a towel between your skin and the bag.    ? Leave the ice on for 20 minutes, 2?3 times a day.    ? Remove the ice if your skin turns bright red. This is very important. If you cannot feel pain, heat, or cold, you have a greater risk of damage to the area.       Move your fingers and toes often to reduce stiffness and swelling.     Raise the injured area above the level of your heart while you are sitting or lying down.    If directed, apply heat to the painful area as often as told by your health care provider. Use the heat source that your health care provider recommends, such as a moist heat pack or a heating pad.   Place a towel between your skin and the heat source.     Leave the heat on for 20?30 minutes.     Remove the heat if your skin turns bright red. This is especially important if you are unable to feel pain, heat, or cold. You have a greater risk of getting burned.        Activity     Rest as told by your health care provider. Do not do anything that causes or worsens pain.     Begin exercising or stretching the affected area as told by your health care provider.     Return to your normal activities as told by your health care provider. Ask your health care provider what activities are safe for you.      If you have  an elastic bandage, sling, or splint:     Wear the bandage, sling, or splint as told by your health care provider. Remove it  only as told by your health care provider.     Loosen it if your fingers or toes below the joint tingle, become numb, or turn cold and blue.     Keep it clean.     Ask your health care provider if you should remove it before bathing.     If the bandage, sling, or splint is not waterproof:  ? Do not let it get wet.    ? Cover it with a watertight covering when you take a bath or shower.        General instructions     Treatment may include medicines for pain and inflammation that are taken by mouth or applied to the skin. Take over-the-counter and prescription medicines only as told by your health care provider.     Do not use any products that contain nicotine or tobacco, such as cigarettes, e-cigarettes, and chewing tobacco. If you need help quitting, ask your health care provider.     Keep all follow-up visits. This is important.        Contact a health care provider if:     You have pain that gets worse and does not get better with medicine.     Your joint pain does not improve within 3 days.     You have increased bruising or swelling.     You have a fever.     You lose 10 lb (4.5 kg) or more without trying.      Get help right away if:     You cannot move the joint.     Your fingers or toes tingle, become numb, or turn cold and blue.     You have a fever along with a joint that is red, warm, and swollen.      Summary     Joint pain may be caused by many things.     Your health care provider may recommend that you take pain medicine or wear a supportive device such as an elastic bandage, sling, or splint.     If your joint pain continues, you may need tests to diagnose the cause of your joint pain.     Take over-the-counter and prescription medicines only as told by your health care provider.      This information is not intended to replace advice given to you by your health care provider. Make sure you discuss any questions you have with your health care provider.      Document Revised: 06/02/2019 Document  Reviewed: 06/02/2019  Elsevier Patient Education ? 2021 Elsevier Inc.       Influenza, Adult    Influenza, more commonly known as the flu, is a viral infection that mainly affects the respiratory tract. The respiratory tract includes organs that help you breathe, such as the lungs, nose, and throat. The flu causes many symptoms similar to the common cold along with high fever and body aches.    The flu spreads easily from person to person (is contagious). Getting a flu shot (influenza vaccination) every year is the best way to prevent the flu.      What are the causes?    This condition is caused by the influenza virus. You can get the virus by:   Breathing in droplets that are in the air from an infected person's cough  or sneeze.     Touching something that has been exposed to the virus (has been contaminated) and then touching your mouth, nose, or eyes.        What increases the risk?    The following factors may make you more likely to get the flu:   Not washing or sanitizing your hands often.     Having close contact with many people during cold and flu season.     Touching your mouth, eyes, or nose without first washing or sanitizing your hands.     Not getting a yearly (annual) flu shot.      You may have a higher risk for the flu, including serious problems such as a lung infection (pneumonia), if you:   Are older than 65.     Are pregnant.     Have a weakened disease-fighting system (immune system). You may have a weakened immune system if you:  ? Have HIV or AIDS.    ? Are undergoing chemotherapy.    ? Are taking medicines that reduce (suppress) the activity of your immune system.       Have a long-term (chronic) illness, such as heart disease, kidney disease, diabetes, or lung disease.     Have a liver disorder.     Are severely overweight (morbidly obese).     Have anemia. This is a condition that affects your red blood cells.     Have asthma.        What are the signs or symptoms?    Symptoms of this  condition usually begin suddenly and last 4?14 days. They may include:   Fever and chills.     Headaches, body aches, or muscle aches.     Sore throat.     Cough.     Runny or stuffy (congested) nose.     Chest discomfort.     Poor appetite.     Weakness or fatigue.     Dizziness.     Nausea or vomiting.        How is this diagnosed?    This condition may be diagnosed based on:   Your symptoms and medical history.     A physical exam.      Swabbing your nose or throat and testing the fluid for the influenza virus.        How is this treated?    If the flu is diagnosed early, you can be treated with medicine that can help reduce how severe the illness is and how long it lasts (antiviral medicine). This may be given by mouth (orally) or through an IV.    Taking care of yourself at home can help relieve symptoms. Your health care provider may recommend:   Taking over-the-counter medicines.     Drinking plenty of fluids.      In many cases, the flu goes away on its own. If you have severe symptoms or complications, you may be treated in a hospital.      Follow these instructions at home:    Activity     Rest as needed and get plenty of sleep.     Stay home from work or school as told by your health care provider. Unless you are visiting your health care provider, avoid leaving home until your fever has been gone for 24 hours without taking medicine.      Eating and drinking     Take an oral rehydration solution (ORS). This is  a drink that is sold at pharmacies and retail stores.     Drink enough fluid to keep your urine pale yellow.     Drink clear fluids in small amounts as you are able. Clear fluids include water, ice chips, diluted fruit juice, and low-calorie sports drinks.     Eat bland, easy-to-digest foods in small amounts as you are able. These foods include bananas, applesauce, rice, lean meats, toast, and crackers.     Avoid drinking fluids that contain a lot of sugar or caffeine, such as energy drinks,  regular sports drinks, and soda.     Avoid alcohol.     Avoid spicy or fatty foods.      General instructions         Take over-the-counter and prescription medicines only as told by your health care provider.     Use a cool mist humidifier to add humidity to the air in your home. This can make it easier to breathe.     Cover your mouth and nose when you cough or sneeze.     Wash your hands with soap and water often, especially after you cough or sneeze. If soap and water are not available, use alcohol-based hand sanitizer.     Keep all follow-up visits as told by your health care provider. This is important.        How is this prevented?       Get an annual flu shot. You may get the flu shot in late summer, fall, or winter. Ask your health care provider when you should get your flu shot.     Avoid contact with people who are sick during cold and flu season. This is generally fall and winter.      Contact a health care provider if:     You develop new symptoms.     You have:  ? Chest pain.    ? Diarrhea.    ? A fever.       Your cough gets worse.     You produce more mucus.     You feel nauseous or you vomit.      Get help right away if:     You develop shortness of breath or difficulty breathing.     Your skin or nails turn a bluish color.     You have severe pain or stiffness in your neck.     You develop a sudden headache or sudden pain in your face or ear.     You cannot eat or drink without vomiting.      Summary     Influenza, more commonly known as the flu, is a viral infection that primarily affects your respiratory tract.     Symptoms of the flu usually begin suddenly and last 4?14 days.     Getting an annual flu shot is the best way to prevent getting the flu.     Stay home from work or school as told by your health care provider. Unless you are visiting your health care provider, avoid leaving home until your fever has been gone for 24 hours without taking medicine.     Keep all follow-up visits as told  by your health care provider. This is important.      This information is not intended to replace advice given to you by your health care provider. Make sure you discuss any questions you have with your health care provider.      Document  Revised: 05/21/2018 Document Reviewed: 08/06/2017  Elsevier Patient Education ? 2021 Elsevier Inc.      ---------------------------------------------------------------------------------------------------------------------  [facility] allows patients to review your COVID and other test results as well as discharge documents from any [name], Emergency Department, surgical center or outpatient lab. Test results are typically available 36 hours after the test is completed.     [facility] encourages you to self-enroll in the [facility] Patient Portal.     To begin your self-enrollment process, please visit [email or web address] Under [facility], click on "Sign up now".     NOTE: You must be 16 years and older to use [facility] Self-Enroll online. If you are a parent, caregiver, or guardian; you need an invite to access your child's or dependent's health records. To obtain an invite, contact the Medical Records department at [phone] Monday through Friday, 8-4:30, select option 3 . If we receive your call afterhours, we will return your call the next business day.     If you have issues trying to create or access your account, contact [employer] support at [phone] available 7 days a week 24 hours a day.     Comment:

## 2020-06-29 NOTE — ED Notes (Signed)
 ED Patient Education Note     Patient Education Materials Follows:  Infectious Disease     Influenza, Adult    Influenza, more commonly known as the flu, is a viral infection that mainly affects the respiratory tract. The respiratory tract includes organs that help you breathe, such as the lungs, nose, and throat. The flu causes many symptoms similar to the common cold along with high fever and body aches.    The flu spreads easily from person to person (is contagious). Getting a flu shot (influenza vaccination) every year is the best way to prevent the flu.      What are the causes?    This condition is caused by the influenza virus. You can get the virus by:   Breathing in droplets that are in the air from an infected person's cough or sneeze.     Touching something that has been exposed to the virus (has been contaminated) and then touching your mouth, nose, or eyes.        What increases the risk?    The following factors may make you more likely to get the flu:   Not washing or sanitizing your hands often.     Having close contact with many people during cold and flu season.     Touching your mouth, eyes, or nose without first washing or sanitizing your hands.     Not getting a yearly (annual) flu shot.      You may have a higher risk for the flu, including serious problems such as a lung infection (pneumonia), if you:   Are older than 65.     Are pregnant.     Have a weakened disease-fighting system (immune system). You may have a weakened immune system if you:  ? Have HIV or AIDS.    ? Are undergoing chemotherapy.    ? Are taking medicines that reduce (suppress) the activity of your immune system.       Have a long-term (chronic) illness, such as heart disease, kidney disease, diabetes, or lung disease.     Have a liver disorder.     Are severely overweight (morbidly obese).     Have anemia. This is a condition that affects your red blood cells.     Have asthma.        What are the signs or symptoms?     Symptoms of this condition usually begin suddenly and last 4?14 days. They may include:   Fever and chills.     Headaches, body aches, or muscle aches.     Sore throat.     Cough.     Runny or stuffy (congested) nose.     Chest discomfort.     Poor appetite.     Weakness or fatigue.     Dizziness.     Nausea or vomiting.        How is this diagnosed?    This condition may be diagnosed based on:   Your symptoms and medical history.     A physical exam.      Swabbing your nose or throat and testing the fluid for the influenza virus.        How is this treated?    If the flu is diagnosed early, you can be treated with medicine that can help reduce how severe the illness is and how long it lasts (antiviral medicine). This may be given by mouth (orally) or through an IV.  Taking care of yourself at home can help relieve symptoms. Your health care provider may recommend:   Taking over-the-counter medicines.     Drinking plenty of fluids.      In many cases, the flu goes away on its own. If you have severe symptoms or complications, you may be treated in a hospital.      Follow these instructions at home:    Activity     Rest as needed and get plenty of sleep.     Stay home from work or school as told by your health care provider. Unless you are visiting your health care provider, avoid leaving home until your fever has been gone for 24 hours without taking medicine.      Eating and drinking     Take an oral rehydration solution (ORS). This is a drink that is sold at pharmacies and retail stores.     Drink enough fluid to keep your urine pale yellow.     Drink clear fluids in small amounts as you are able. Clear fluids include water, ice chips, diluted fruit juice, and low-calorie sports drinks.     Eat bland, easy-to-digest foods in small amounts as you are able. These foods include bananas, applesauce, rice, lean meats, toast, and crackers.     Avoid drinking fluids that contain a lot of sugar or caffeine, such as  energy drinks, regular sports drinks, and soda.     Avoid alcohol.     Avoid spicy or fatty foods.      General instructions         Take over-the-counter and prescription medicines only as told by your health care provider.     Use a cool mist humidifier to add humidity to the air in your home. This can make it easier to breathe.     Cover your mouth and nose when you cough or sneeze.     Wash your hands with soap and water often, especially after you cough or sneeze. If soap and water are not available, use alcohol-based hand sanitizer.     Keep all follow-up visits as told by your health care provider. This is important.        How is this prevented?       Get an annual flu shot. You may get the flu shot in late summer, fall, or winter. Ask your health care provider when you should get your flu shot.     Avoid contact with people who are sick during cold and flu season. This is generally fall and winter.      Contact a health care provider if:     You develop new symptoms.     You have:  ? Chest pain.    ? Diarrhea.    ? A fever.       Your cough gets worse.     You produce more mucus.     You feel nauseous or you vomit.      Get help right away if:     You develop shortness of breath or difficulty breathing.     Your skin or nails turn a bluish color.     You have severe pain or stiffness in your neck.     You develop a sudden headache or sudden pain in your face or ear.     You cannot eat or drink without vomiting.      Summary     Influenza, more commonly known as  the flu, is a viral infection that primarily affects your respiratory tract.     Symptoms of the flu usually begin suddenly and last 4?14 days.     Getting an annual flu shot is the best way to prevent getting the flu.     Stay home from work or school as told by your health care provider. Unless you are visiting your health care provider, avoid leaving home until your fever has been gone for 24 hours without taking medicine.     Keep all follow-up  visits as told by your health care provider. This is important.      This information is not intended to replace advice given to you by your health care provider. Make sure you discuss any questions you have with your health care provider.      Document Revised: 05/21/2018 Document Reviewed: 08/06/2017  Elsevier Patient Education ? 2021 [employer]      Orthopedics     Joint Pain      Joint pain may be caused by many things. Joint pain is likely to go away when you follow instructions from your health care provider for relieving pain at home. However, joint pain can also be caused by conditions that require more treatment. Common causes of joint pain include:   Bruising in the area of the joint.     Injury caused by repeating certain movements too many times.     Age-related joint wear and tear.     Buildup of uric acid crystals in the joint (gout).     Inflammation of the joint.     Other forms of arthritis.     Infections of the joint or of the bone.      Your health care provider may recommend that you take pain medicine or wear a supportive device like an elastic bandage, sling, or splint. If your joint pain continues, you may need lab or imaging tests to diagnose the cause of your joint pain.      Follow these instructions at home:    Managing pain, stiffness, and swelling         If directed, put ice on the painful area. To do this:  ? If you have a removable elastic bandage, sling, or splint, take it off as told by your doctor.    ? Put ice in a plastic bag.    ? Place a towel between your skin and the bag.    ? Leave the ice on for 20 minutes, 2?3 times a day.    ? Remove the ice if your skin turns bright red. This is very important. If you cannot feel pain, heat, or cold, you have a greater risk of damage to the area.       Move your fingers and toes often to reduce stiffness and swelling.     Raise the injured area above the level of your heart while you are sitting or lying down.    If directed, apply  heat to the painful area as often as told by your health care provider. Use the heat source that your health care provider recommends, such as a moist heat pack or a heating pad.   Place a towel between your skin and the heat source.     Leave the heat on for 20?30 minutes.     Remove the heat if your skin turns bright red. This is especially important if you are unable to feel pain, heat, or  cold. You have a greater risk of getting burned.        Activity     Rest as told by your health care provider. Do not do anything that causes or worsens pain.     Begin exercising or stretching the affected area as told by your health care provider.     Return to your normal activities as told by your health care provider. Ask your health care provider what activities are safe for you.      If you have an elastic bandage, sling, or splint:     Wear the bandage, sling, or splint as told by your health care provider. Remove it only as told by your health care provider.     Loosen it if your fingers or toes below the joint tingle, become numb, or turn cold and blue.     Keep it clean.     Ask your health care provider if you should remove it before bathing.     If the bandage, sling, or splint is not waterproof:  ? Do not let it get wet.    ? Cover it with a watertight covering when you take a bath or shower.        General instructions     Treatment may include medicines for pain and inflammation that are taken by mouth or applied to the skin. Take over-the-counter and prescription medicines only as told by your health care provider.     Do not use any products that contain nicotine or tobacco, such as cigarettes, e-cigarettes, and chewing tobacco. If you need help quitting, ask your health care provider.     Keep all follow-up visits. This is important.        Contact a health care provider if:     You have pain that gets worse and does not get better with medicine.     Your joint pain does not improve within 3 days.     You  have increased bruising or swelling.     You have a fever.     You lose 10 lb (4.5 kg) or more without trying.      Get help right away if:     You cannot move the joint.     Your fingers or toes tingle, become numb, or turn cold and blue.     You have a fever along with a joint that is red, warm, and swollen.      Summary     Joint pain may be caused by many things.     Your health care provider may recommend that you take pain medicine or wear a supportive device such as an elastic bandage, sling, or splint.     If your joint pain continues, you may need tests to diagnose the cause of your joint pain.     Take over-the-counter and prescription medicines only as told by your health care provider.      This information is not intended to replace advice given to you by your health care provider. Make sure you discuss any questions you have with your health care provider.      Document Revised: 06/02/2019 Document Reviewed: 06/02/2019  Elsevier Patient Education ? 2021 Elsevier Inc.

## 2020-06-29 NOTE — ED Provider Notes (Signed)
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   Additional information: Chief Complaint from Nursing Triage Note   Chief Complaint  Chief Complaint: Reports flu like symptoms w D since Tuesday, states took Tylenol approx 1 hr ago. Reports 4 episdoes of diarrhea since MN, states also has L knee pain x 1 mo and would like an Xray while here. (06/29/20 14:06:00).      History of Present Illness   Patient is a 40 year old male who presents to the emergency department for evaluation of multiple complaints.  He states that for the past 2 days he has been having fevers, chills, malaise, diffuse body aches, nasal congestion, scratchy throat, dry cough, diarrhea, mild dysuria.  He denies any headache or neck stiffness, chest pain, shortness of breath, abdominal pain, vomiting, hematuria, joint swelling, rashes.  He also reports some discomfort and clicking in the left knee since hiking last month and would like it looked at.  He denies any fall or injury or joint instability.  No prior orthopedic surgeries or injections.  He took Tylenol earlier with some improvement.  He denies any known ill contacts.  He got 2 doses of Covid-19 vaccine, but no booster or annual flu vaccine..        Review of Systems   Constitutional symptoms:  Fever, chills.    Skin symptoms:  No rash,    ENMT symptoms:  Sore throat, nasal congestion.    Respiratory symptoms:  Cough, No shortness of breath,    Cardiovascular symptoms:  No chest pain, no syncope.    Gastrointestinal symptoms:  Diarrhea, no abdominal pain, no nausea, no vomiting.    Genitourinary symptoms:  Dysuria, no hematuria, no testicular pain.     Musculoskeletal symptoms:  Muscle pain, No back pain,    Neurologic symptoms:  No headache, no dizziness.    Allergy/immunologic symptoms:  No recurrent infections, no impaired immunity.       Health Status   Allergies:    Allergic Reactions (All)  Moderate  Sulfa drugs- Rash..      Past Medical/ Family/ Social History   Medical history: Reviewed as documented in chart.   Surgical history: Reviewed as documented in chart.   Family history: Not significant.   Social history: Reviewed as documented in chart.   Problem list:    Active Problems (3)  Diverticulitis   Ex-smoker   SVT (supraventricular tachycardia)   , per nurse's notes.      Physical Examination               Vital Signs   Vital Signs   06/29/2020 14:06 EDT Systolic Blood Pressure 150 mmHg  HI    Diastolic Blood Pressure 87 mmHg    Temperature Oral 37.9 degC  HI    Heart Rate Monitored 108 bpm  HI    Respiratory Rate 16 br/min    SpO2 100 %   .   Measurements   06/29/2020 14:10 EDT Body Mass Index est meas 33.14 kg/m2    Body Mass Index Measured 33.14 kg/m2   06/29/2020 14:06 EDT Height/Length Measured 178 cm    Weight Dosing 105 kg   .   General:  Alert, no acute distress.    Skin:  Warm, dry.    Head:  Normocephalic, atraumatic.    Eye:  Extraocular movements are intact, normal conjunctiva.    Cardiovascular:  Regular rate and rhythm, Normal peripheral perfusion.    Respiratory:  Lungs are clear to auscultation, respirations are non-labored, breath sounds are equal, Symmetrical chest wall expansion.    Gastrointestinal:  Soft, Nontender.    Back:  Nontender, Normal range of motion, Normal alignment, no step-offs.    Musculoskeletal:  Normal ROM, normal strength, no tenderness, no swelling, no deformity, No tenderness, swelling, painful range of motion, or other findings on exam of left knee.Marland Kitchen    Neurological:  Alert and oriented to person, place, time, and situation, No focal neurological deficit observed.    Psychiatric:  Cooperative, appropriate mood &  affect, normal judgment.       Medical Decision Making   Differential Diagnosis:  Viral syndrome.   Rationale:  PA/NP reviewed with co-signing physician: lab results.   Documents reviewed:  Emergency department nurses' notes.   Results review:  Lab results : Lab View   06/29/2020 15:17 EDT Appear U POC Clear    Color U POC Yellow    Bili U POC Negative    Blood U POC Trace    Glucose U POC Negative mg/dL    Ketones U POC Negative mg/dL    Leuk Est U POC Negative    Nitrite U POC Negative    pH U POC 6.5    Protein U POC Negative    Spec Grav U POC 1.010    Urobilin U POC 0.2 EU/dL   5/95/6387 56:43 EDT COVID (SARS-CoV-2) (LIAT) Not Detected    Influenza A (LIAT) Detected    Influenza B (LIAT) Not Detected   06/29/2020 14:10 EDT Estimated Creatinine Clearance 119.32 mL/min   .   Notes:  40 year old male with flulike symptoms for the past 2 days.  Also having ongoing left knee pain after hiking last month.  He is resting comfortably, no acute distress.  Hemodynamically stable and nontoxic in appearance.  Unremarkable physical exam.  He is positive for influenza A today.  He is within the treatment window for Tamiflu and this will be prescribed.  We discussed symptomatic and supportive measures otherwise.  No evidence of emergent joint issue going on with his knee, and will refer to orthopedics for further evaluation.  We reviewed signs and symptoms for which return to the emergency department he voices understanding.  Patient is stable for discharge home.      Impression and Plan   Diagnosis   Influenza A (ICD10-CM J10.1, Discharge, Medical)   Knee pain, left (ICD10-CM M25.562, Discharge, Medical)   Plan   Condition: Stable.    Disposition: Discharged: to home.    Prescriptions: Launch prescriptions   Pharmacy:  Tamiflu 75 mg oral capsule (Prescribe): 75 mg, 1 caps, Oral, BID, for 5 days, 10 caps, 0 Refill(s).    Patient was given the following educational materials: Influenza, Adult, Joint Pain.    Follow up with:  SCOTT MCDERMOTT Within 1 week Please follow-up  with your primary care provider.  Rest and stay hydrated.  Tylenol or Motrin as needed for fever or discomfort.  Follow-up with orthopedics regarding ongoing left knee pain.  Rest, ice, compression with Ace wrap, elevation  If you plan to take Tamiflu, recommend starting it today.  Return to the emergency department for any worsening or new concerning symptoms as discussed.  .    Counseled: Patient, Regarding diagnosis, Regarding diagnostic results, Regarding treatment plan, Regarding prescription, Patient indicated understanding of instructions.    Signature Line     Electronically Signed on 06/29/2020 04:39 PM EDT   ________________________________________________   Marca Ancona LANE-PA               Modified by: Nicolasa Ducking on 06/29/2020 03:58 PM EDT      Modified by: Nicolasa Ducking on 06/29/2020 04:39 PM EDTAddendum by Howie Ill M-MD on June 30, 2020 8:36 EDT               I was present and available for consult during patient's ER visit. Agree with above treatment and plan   Signature Line     Electronically Signed on 06/30/2020 08:36 AM EDT   ________________________________________________   Howie Ill M-MD               Modified by: Howie Ill M-MD on 06/30/2020 08:36 AM EDT

## 2020-06-29 NOTE — ED Notes (Signed)
ED Triage Note       ED Triage Adult Entered On:  06/29/2020 14:10 EDT    Performed On:  06/29/2020 14:06 EDT by Pryor Montes, RN, Shelley               Triage   Numeric Rating Pain Scale :   5 = Moderate pain   Chief Complaint :   Reports flu like symptoms w D since Tuesday, states took Tylenol approx 1 hr ago. Reports 4 episdoes of diarrhea since MN, states also has L knee pain x 1 mo and would like an Xray while here.    [place] Mode of Arrival :   Private vehicle   Infectious Disease Documentation :   Document assessment   Patient received chemo or biotherapy last 48 hrs? :   No   Temperature Oral :   37.9 degC(Converted to: 100.2 degF)  (HI)    Heart Rate Monitored :   108 bpm (HI)    Respiratory Rate :   16 br/min   Systolic Blood Pressure :   150 mmHg (HI)    Diastolic Blood Pressure :   87 mmHg   SpO2 :   100 %   Oxygen Therapy :   Room air   Patient presentation :   HR > 100   Chief Complaint or Presentation suggest infection :   No   Weight Dosing :   105 kg(Converted to: 231 lb 8 oz)    Height :   178 cm(Converted to: 5 ft 10 in)    Body Mass Index Dosing :   33 kg/m2   Wylene Men - 06/29/2020 14:06 EDT   DCP GENERIC CODE   Tracking Acuity :   3   Tracking Group :   ED [employer]   Smo[place], [place] - 06/29/2020 14:06 EDT   ED General Section :   Document assessment   Pregnancy Status :   N/A   ED Allergies Section :   Document assessment   ED Reason for Visit Section :   Document assessment   Pryor Montes RNVance Gather - 06/29/2020 14:06 EDT   ID Risk Screen Symptoms   Recent Travel History :   No recent travel   Last 90 days COVID-19 ID :   No   Close Contact with COVID-19 ID :   No   Last 14 days COVID-19 ID :   No   Pryor Montes RNVance Gather - 06/29/2020 14:06 EDT   Allergies   (As Of: 06/29/2020 14:10:05 EDT)   Allergies (Active)   sulfa drugs  Estimated Onset Date:   Unspecified ; Reactions:   rash ; Created By:   Donivan Scull RN, Anderson Malta; Reaction Status:   Active ; Category:   Drug ; Substance:   sulfa  drugs ; Type:   Allergy ; Severity:   Moderate ; Updated By:   Donivan Scull RN, Anderson Malta; Reviewed Date:   06/29/2020 14:09 EDT        Psycho-Social   Last 3 mo, thoughts killing self/others :   Patient denies   Right click within box for Suspected Abuse policy link. :   None   Feels Safe Where Live :   Yes   Wylene Men - 06/29/2020 14:06 EDT   ED Reason for Visit   (As Of: 06/29/2020 14:10:05 EDT)   Problems(Active)    Diverticulitis (SNOMED CT  :768115726 )  Name of Problem:   Diverticulitis ; Recorder:  Ace, RN, Anderson Malta; Confirmation:   Confirmed ; Classification:   Patient Stated ; Code:   [id number] ; Contributor System:   Dietitian ; Last Updated:   01/18/2019 8:24 EST ; Life Cycle Date:   01/18/2019 ; Life Cycle Status:   Active ; Vocabulary:   SNOMED CT        Ex-smoker (SNOMED CT  :81856314 )  Name of Problem:   Ex-smoker ; Recorder:   Ace, RN, Anderson Malta; Confirmation:   Confirmed ; Classification:   Patient Stated ; Code:   [id number] ; Contributor System:   PowerChart ; Last Updated:   01/18/2019 8:25 EST ; Life Cycle Date:   01/18/2019 ; Life Cycle Status:   Active ; Vocabulary:   SNOMED CT        SVT (supraventricular tachycardia) (SNOMED CT  :58850277 )  Name of Problem:   SVT (supraventricular tachycardia) ; Recorder:   Ace, RN, Anderson Malta; Confirmation:   Confirmed ; Classification:   Patient Stated ; Code:   [id number] ; Contributor System:   PowerChart ; Last Updated:   01/18/2019 8:24 EST ; Life Cycle Date:   01/18/2019 ; Life Cycle Status:   Active ; Vocabulary:   SNOMED CT          Diagnoses(Active)    Diarrhea  Date:   06/29/2020 ; Diagnosis Type:   Reason For Visit ; Confirmation:   Complaint of ; Clinical Dx:   Diarrhea ; Classification:   Medical ; Clinical Service:   Non-Specified ; Code:   PNED ; Probability:   0 ; Diagnosis Code:   [id number]

## 2020-12-05 NOTE — ED Notes (Signed)
ED Patient Education Note     Patient Education Materials Follows:          Constipation, Adult  Constipation is when a person has fewer than three bowel movements in a week, has difficulty having a bowel movement, or has stools (feces) that are dry, hard, or larger than normal. Constipation may be caused by an underlying condition. It may become worse with age if a person takes certain medicines and does not take in enough fluids.  Follow these instructions at home:  Eating and drinking    . Eat foods that have a lot of fiber, such as beans, whole grains, and fresh fruits and vegetables.  . Limit foods that are low in fiber and high in fat and processed sugars, such as fried or sweet foods. These include french fries, hamburgers, cookies, candies, and soda.  . Drink enough fluid to keep your urine pale yellow.  General instructions  . Exercise regularly or as told by your health care provider. Try to do 150 minutes of moderate exercise each week.  . Use the bathroom when you have the urge to go. Do not hold it in.  . Take over-the-counter and prescription medicines only as told by your health care provider. This includes any fiber supplements.  . During bowel movements:   ? Practice deep breathing while relaxing the lower abdomen.  ? Practice pelvic floor relaxation.  . Watch your condition for any changes. Let your health care provider know about them.  Marland Kitchen Keep all follow-up visits as told by your health care provider. This is important.  Contact a health care provider if:  . You have pain that gets worse.  . You have a fever.  . You do not have a bowel movement after 4 days.  . You vomit.  . You are not hungry or you lose weight.  . You are bleeding from the opening between the buttocks (anus).  . You have thin, pencil-like stools.  Get help right away if:  . You have a fever and your symptoms suddenly get worse.  . You leak stool or have blood in your stool.  . Your abdomen is bloated.  . You have severe pain  in your abdomen.  . You feel dizzy or you faint.  Summary  . Constipation is when a person has fewer than three bowel movements in a week, has difficulty having a bowel movement, or has stools (feces) that are dry, hard, or larger than normal.  . Eat foods that have a lot of fiber, such as beans, whole grains, and fresh fruits and vegetables.  . Drink enough fluid to keep your urine pale yellow.  . Take over-the-counter and prescription medicines only as told by your health care provider. This includes any fiber supplements.      Bowel cleanout regimen:    Begin a liquid diet with only liquids for the rest of the day.  Take 2 Dulcolax (bisacodyl) tablets. (DO NOT CHEW.) before bedtime.    In the morning:  Mix the entire 238-gram bottle of MiraLAX with 64 ounces of a sports drink.  Drink all of the mixture over the next few hours until gone. (Suggestion: An 8-ounce glass every 15-30 minutes equals 2-4 hours.)  It is very important to drink plenty of water and other liquids in order to avoid dehydration and to flush the bowel. (Although alcohol is a liquid, it can make you dehydrated. You should NOT drink alcohol while doing the cleanout.)  NOTE: Please stay home once you have started your cleanout. Also, the use of moist towelettes or wipes may help to minimize discomfort during the cleanout. A nonprescription 1% hydrocortisone cream may also be soothing when applied to the rectal area after each bowel movement.  It is common during the cleanout to experience some nausea, bloating, and/or abdominal distention. If you chilled the mixture prior to drinking it, you could experience chills from consuming so much cold liquid in a short time period. If you develop nausea or vomiting, slow down the rate at which you drink the solution. Please attempt to drink all of the laxative solution even if it takes you longer.  Once stooling slows down, you may resume eating solid food.

## 2020-12-05 NOTE — Discharge Summary (Signed)
Alfonzo Beers LYN-PA-C ED MidLevel 12/05/2020 21:38:25    Lendell Caprice RN, Neill Loft ED Nurse 12/05/2020 21:52:54        Attending Physician:  Alfonzo Beers LYN-PA-C     Admit Doc  Alfonzo Beers LYN-PA-C     Consulting Doc       VITALS INFORMATION  Vital Sign Triage Latest   Temp Oral ORAL_1%>36.8 degC ORAL%>36.8 degC   Temp Temporal TEMPORAL_1%> TEMPORAL%>   Temp Intravascular INTRAVASCULAR_1%> INTRAVASCULAR%>   Temp Axillary AXILLARY_1%> AXILLARY%>   Temp Rectal RECTAL_1%> RECTAL%>   02 Sat 100 % 96 %   Respiratory Rate RATE_1%>18 br/min RATE%>16 br/min   Peripheral Pulse Rate PULSE RATE_1%>60 bpm PULSE RATE%>64 bpm   Apical Heart Rate HEART RATE_1%> HEART RATE%>   Blood Pressure BLOOD PRESSURE_1%>/ BLOOD PRESSURE_1%>95 mmHg BLOOD PRESSURE%>134 mmHg / BLOOD PRESSURE%>79 mmHg                 Immunizations      No Immunizations Documented This Visit          DISCHARGE INFORMATION   Discharge Disposition: H Outpt-Sent Home   Discharge Location:    Home   Discharge Date and Time:    12/05/2020 23:41:59   ED Checkout Date  and Time:    12/05/2020 23:41:59     DEPART REASON INCOMPLETE INFORMATION               Depart Action Incomplete Reason   Interactive View/I&O Recently assessed               Problems      No Problems Documented              Smoking Status      Former smoker, quit more than 30 days ago         PATIENT EDUCATION INFORMATION  Instructions:       Constipation, Adult mirilax bowel prep/cleanout (XI33825)     Follow up:                    With: Address: When:   [facility], [address], [address] [place], [place] [address]  [phone] Business (1) Within 1 week   Comments:   Today your work-up here in the emergency department is reassuring   Take MiraLAX for bowel clean out   Follow-up with [facility] at first available appointment   Return to the emergency department immediately for any new or worsening symptoms           ED PROVIDER DOCUMENTATION     Patient:   [name], [name]            MRN: [id number]            FIN: [id number]               Age:   2 years     Sex:  Male     DOB:  1980-11-12   Associated Diagnoses:   Constipation in male   Author:   Alfonzo Beers LYN-PA-C      Basic Information   Time seen: Provider Seen (ST)   ED Provider/Time:    CONDY,  ALEXANDRA LYN-PA-C / 12/05/2020 21:38  .   Additional information: Chief Complaint from Nursing Triage Note   Chief Complaint  Chief Complaint: c/o rt flank and LLQ pain x wks, hx of diverticulitis. Some NV but now noted to be constipated (12/05/20 21:42:00).  ED Clinical Summary                         [facility]  [address]  [place], [place], [address]  [phone]           PERSON INFORMATION  Name: Roberto, [name] Age:  40 Years DOB: Jan 05, 1981   Sex: Male Language: English PCP: Hezzie Bump M-MD   Marital Status:  Single Phone: [phone] Med Service: MED-Medicine   MRN:  [id number] Acct# [id number] Arrival: 12/05/2020 21:35:00   Visit Reason: Abdominal pain; ABDOMINAL PAIN Acuity: 3 LOS: 000 02:06   Address:      [address] [place] [address]  Diagnosis:      Constipation in male  Printed Prescriptions:            Allergies      sulfa drugs (rash)      Medications Administered During Visit:                  Medication Dose Route   Sodium Chloride 0.9% 1000 mL IV Piggyback   ondansetron 4 mg IV Push   iopamidol 100 mL IV Contrast       Patient Medication List:              fluticasone nasal (Flonase) every day.  hyoscyamine (hyoscyamine 0.125 mg sublingual tablet) 1 Tabs Sublingual (dissolve under the tongue) every 4 hours.  omeprazole Oral (given by mouth) every day.         Major Tests and Procedures:  The following procedures and tests were performed during your ED visit.  COMMONPROCEDURES%>  COMMON PROCEDURESCOMMENTS%>          Laboratory Orders  Name Status Details   .UA POC Completed Urine, RT, RT - Routine, Collected, 12/05/20 22:11:00 EDT, Nurse collect, 12/05/20 22:11:00 [facility], RAL POC Login   BMP Completed Blood, Stat, ST - Stat, 12/05/20 21:53:00 EDT, 12/05/20 21:54:00 EDT, Nurse collect, CONDY,  ALEXANDRA LYN-PA-C, Print label Y/N   CBCDIFF Completed Blood, Stat, ST - Stat, 12/05/20 21:53:00 EDT, 12/05/20 21:54:00 EDT, Nurse collect, CONDY,  ALEXANDRA LYN-PA-C, Print label Y/N   Hepatic Completed Blood, Stat, ST - Stat, 12/05/20 21:53:00 EDT, 12/05/20 21:54:00 EDT, Nurse collect, CONDY,  ALEXANDRA LYN-PA-C, Print label Y/N   Lipase Lvl Completed Blood, Stat, ST - Stat, 12/05/20 21:53:00  EDT, 12/05/20 21:54:00 EDT, Nurse collect, CONDY,  ALEXANDRA LYN-PA-C, Print label Y/N               Radiology Orders  Name Status Details   CT Abdomen Pelvis w/ Contrast Ordered 12/05/20 21:53:00 EDT, STAT 1 hour or less, Reason: Abdominal pain, acute, nonlocalized, Transport Mode: STRETCHER, pp_set_radiology_subspecialty               Patient Care Orders  Name Status Details   Discharge Patient Ordered 12/05/20 23:20:00 EDT   ED Assessment Adult Completed 12/05/20 21:44:36 EDT, 12/05/20 21:44:36 EDT   ED Secondary Triage Completed 12/05/20 21:44:36 EDT, 12/05/20 21:44:36 EDT   ED Triage Adult Completed 12/05/20 21:35:48 EDT, 12/05/20 21:35:48 EDT   POC-Urine Dipstick collect Completed 12/05/20 21:53:00 EDT, Once, 12/05/20 21:53:00 EDT   Saline Lock Insert Completed 12/05/20 21:53:00 EDT, Once, 12/05/20 21:53:00 EDT             PROVIDER INFORMATION               Provider Role Assigned Russell  Alfonzo Beers LYN-PA-C ED MidLevel 12/05/2020 21:38:25    Lendell Caprice RN, Neill Loft ED Nurse 12/05/2020 21:52:54        Attending Physician:  Alfonzo Beers LYN-PA-C     Admit Doc  Alfonzo Beers LYN-PA-C     Consulting Doc       VITALS INFORMATION  Vital Sign Triage Latest   Temp Oral ORAL_1%>36.8 degC ORAL%>36.8 degC   Temp Temporal TEMPORAL_1%> TEMPORAL%>   Temp Intravascular INTRAVASCULAR_1%> INTRAVASCULAR%>   Temp Axillary AXILLARY_1%> AXILLARY%>   Temp Rectal RECTAL_1%> RECTAL%>   02 Sat 100 % 96 %   Respiratory Rate RATE_1%>18 br/min RATE%>16 br/min   Peripheral Pulse Rate PULSE RATE_1%>60 bpm PULSE RATE%>64 bpm   Apical Heart Rate HEART RATE_1%> HEART RATE%>   Blood Pressure BLOOD PRESSURE_1%>/ BLOOD PRESSURE_1%>95 mmHg BLOOD PRESSURE%>134 mmHg / BLOOD PRESSURE%>79 mmHg                 Immunizations      No Immunizations Documented This Visit          DISCHARGE INFORMATION   Discharge Disposition: H Outpt-Sent Home   Discharge Location:    Home   Discharge Date and Time:    12/05/2020 23:41:59   ED Checkout Date  and Time:    12/05/2020 23:41:59     DEPART REASON INCOMPLETE INFORMATION               Depart Action Incomplete Reason   Interactive View/I&O Recently assessed               Problems      No Problems Documented              Smoking Status      Former smoker, quit more than 30 days ago         PATIENT EDUCATION INFORMATION  Instructions:       Constipation, Adult mirilax bowel prep/cleanout (XI33825)     Follow up:                    With: Address: When:   [facility], [address], [address] [place], [place] [address]  [phone] Business (1) Within 1 week   Comments:   Today your work-up here in the emergency department is reassuring   Take MiraLAX for bowel clean out   Follow-up with [facility] at first available appointment   Return to the emergency department immediately for any new or worsening symptoms           ED PROVIDER DOCUMENTATION     Patient:   [name], [name]            MRN: [id number]            FIN: [id number]               Age:   2 years     Sex:  Male     DOB:  1980-11-12   Associated Diagnoses:   Constipation in male   Author:   Alfonzo Beers LYN-PA-C      Basic Information   Time seen: Provider Seen (ST)   ED Provider/Time:    CONDY,  ALEXANDRA LYN-PA-C / 12/05/2020 21:38  .   Additional information: Chief Complaint from Nursing Triage Note   Chief Complaint  Chief Complaint: c/o rt flank and LLQ pain x wks, hx of diverticulitis. Some NV but now noted to be constipated (12/05/20 21:42:00).  Alfonzo Beers LYN-PA-C ED MidLevel 12/05/2020 21:38:25    Lendell Caprice RN, Neill Loft ED Nurse 12/05/2020 21:52:54        Attending Physician:  Alfonzo Beers LYN-PA-C     Admit Doc  Alfonzo Beers LYN-PA-C     Consulting Doc       VITALS INFORMATION  Vital Sign Triage Latest   Temp Oral ORAL_1%>36.8 degC ORAL%>36.8 degC   Temp Temporal TEMPORAL_1%> TEMPORAL%>   Temp Intravascular INTRAVASCULAR_1%> INTRAVASCULAR%>   Temp Axillary AXILLARY_1%> AXILLARY%>   Temp Rectal RECTAL_1%> RECTAL%>   02 Sat 100 % 96 %   Respiratory Rate RATE_1%>18 br/min RATE%>16 br/min   Peripheral Pulse Rate PULSE RATE_1%>60 bpm PULSE RATE%>64 bpm   Apical Heart Rate HEART RATE_1%> HEART RATE%>   Blood Pressure BLOOD PRESSURE_1%>/ BLOOD PRESSURE_1%>95 mmHg BLOOD PRESSURE%>134 mmHg / BLOOD PRESSURE%>79 mmHg                 Immunizations      No Immunizations Documented This Visit          DISCHARGE INFORMATION   Discharge Disposition: H Outpt-Sent Home   Discharge Location:    Home   Discharge Date and Time:    12/05/2020 23:41:59   ED Checkout Date  and Time:    12/05/2020 23:41:59     DEPART REASON INCOMPLETE INFORMATION               Depart Action Incomplete Reason   Interactive View/I&O Recently assessed               Problems      No Problems Documented              Smoking Status      Former smoker, quit more than 30 days ago         PATIENT EDUCATION INFORMATION  Instructions:       Constipation, Adult mirilax bowel prep/cleanout (XI33825)     Follow up:                    With: Address: When:   [facility], [address], [address] [place], [place] [address]  [phone] Business (1) Within 1 week   Comments:   Today your work-up here in the emergency department is reassuring   Take MiraLAX for bowel clean out   Follow-up with [facility] at first available appointment   Return to the emergency department immediately for any new or worsening symptoms           ED PROVIDER DOCUMENTATION     Patient:   [name], [name]            MRN: [id number]            FIN: [id number]               Age:   2 years     Sex:  Male     DOB:  1980-11-12   Associated Diagnoses:   Constipation in male   Author:   Alfonzo Beers LYN-PA-C      Basic Information   Time seen: Provider Seen (ST)   ED Provider/Time:    CONDY,  ALEXANDRA LYN-PA-C / 12/05/2020 21:38  .   Additional information: Chief Complaint from Nursing Triage Note   Chief Complaint  Chief Complaint: c/o rt flank and LLQ pain x wks, hx of diverticulitis. Some NV but now noted to be constipated (12/05/20 21:42:00).  ED Clinical Summary                         [facility]  [address]  [place], [place], [address]  [phone]           PERSON INFORMATION  Name: Roberto, [name] Age:  40 Years DOB: Jan 05, 1981   Sex: Male Language: English PCP: Hezzie Bump M-MD   Marital Status:  Single Phone: [phone] Med Service: MED-Medicine   MRN:  [id number] Acct# [id number] Arrival: 12/05/2020 21:35:00   Visit Reason: Abdominal pain; ABDOMINAL PAIN Acuity: 3 LOS: 000 02:06   Address:      [address] [place] [address]  Diagnosis:      Constipation in male  Printed Prescriptions:            Allergies      sulfa drugs (rash)      Medications Administered During Visit:                  Medication Dose Route   Sodium Chloride 0.9% 1000 mL IV Piggyback   ondansetron 4 mg IV Push   iopamidol 100 mL IV Contrast       Patient Medication List:              fluticasone nasal (Flonase) every day.  hyoscyamine (hyoscyamine 0.125 mg sublingual tablet) 1 Tabs Sublingual (dissolve under the tongue) every 4 hours.  omeprazole Oral (given by mouth) every day.         Major Tests and Procedures:  The following procedures and tests were performed during your ED visit.  COMMONPROCEDURES%>  COMMON PROCEDURESCOMMENTS%>          Laboratory Orders  Name Status Details   .UA POC Completed Urine, RT, RT - Routine, Collected, 12/05/20 22:11:00 EDT, Nurse collect, 12/05/20 22:11:00 [facility], RAL POC Login   BMP Completed Blood, Stat, ST - Stat, 12/05/20 21:53:00 EDT, 12/05/20 21:54:00 EDT, Nurse collect, CONDY,  ALEXANDRA LYN-PA-C, Print label Y/N   CBCDIFF Completed Blood, Stat, ST - Stat, 12/05/20 21:53:00 EDT, 12/05/20 21:54:00 EDT, Nurse collect, CONDY,  ALEXANDRA LYN-PA-C, Print label Y/N   Hepatic Completed Blood, Stat, ST - Stat, 12/05/20 21:53:00 EDT, 12/05/20 21:54:00 EDT, Nurse collect, CONDY,  ALEXANDRA LYN-PA-C, Print label Y/N   Lipase Lvl Completed Blood, Stat, ST - Stat, 12/05/20 21:53:00  EDT, 12/05/20 21:54:00 EDT, Nurse collect, CONDY,  ALEXANDRA LYN-PA-C, Print label Y/N               Radiology Orders  Name Status Details   CT Abdomen Pelvis w/ Contrast Ordered 12/05/20 21:53:00 EDT, STAT 1 hour or less, Reason: Abdominal pain, acute, nonlocalized, Transport Mode: STRETCHER, pp_set_radiology_subspecialty               Patient Care Orders  Name Status Details   Discharge Patient Ordered 12/05/20 23:20:00 EDT   ED Assessment Adult Completed 12/05/20 21:44:36 EDT, 12/05/20 21:44:36 EDT   ED Secondary Triage Completed 12/05/20 21:44:36 EDT, 12/05/20 21:44:36 EDT   ED Triage Adult Completed 12/05/20 21:35:48 EDT, 12/05/20 21:35:48 EDT   POC-Urine Dipstick collect Completed 12/05/20 21:53:00 EDT, Once, 12/05/20 21:53:00 EDT   Saline Lock Insert Completed 12/05/20 21:53:00 EDT, Once, 12/05/20 21:53:00 EDT             PROVIDER INFORMATION               Provider Role Assigned Russell

## 2020-12-05 NOTE — ED Notes (Signed)
ED Triage Note       ED Secondary Triage Entered On:  12/05/2020 22:13 EDT    Performed On:  12/05/2020 22:12 EDT by Lendell Caprice, RN, Devoe G               General Information   Barriers to Learning :   None evident   ED Home Meds Section :   Document assessment   [facility] ED Fall Risk Section :   Document assessment   ED Advance Directives Section :   Document assessment   ED Palliative Screen :   N/A (prefilled for <40yo)   Lendell Caprice RN, Gretchen Short G - 12/05/2020 22:12 EDT   (As Of: 12/05/2020 22:13:33 EDT)   Problems(Active)    Diverticulitis (SNOMED CT  :824235361 )  Name of Problem:   Diverticulitis ; Recorder:   Ace, RN, Anderson Malta; Confirmation:   Confirmed ; Classification:   Patient Stated ; Code:   [id number] ; Contributor System:   Dietitian ; Last Updated:   01/18/2019 8:24 EST ; Life Cycle Date:   01/18/2019 ; Life Cycle Status:   Active ; Vocabulary:   SNOMED CT        Ex-smoker (SNOMED CT  :67619509 )  Name of Problem:   Ex-smoker ; Recorder:   Ace, RN, Anderson Malta; Confirmation:   Confirmed ; Classification:   Patient Stated ; Code:   [id number] ; Contributor System:   PowerChart ; Last Updated:   01/18/2019 8:25 EST ; Life Cycle Date:   01/18/2019 ; Life Cycle Status:   Active ; Vocabulary:   SNOMED CT        SVT (supraventricular tachycardia) (SNOMED CT  :80998338 )  Name of Problem:   SVT (supraventricular tachycardia) ; Recorder:   Ace, RN, Anderson Malta; Confirmation:   Confirmed ; Classification:   Patient Stated ; Code:   [id number] ; Contributor System:   PowerChart ; Last Updated:   01/18/2019 8:24 EST ; Life Cycle Date:   01/18/2019 ; Life Cycle Status:   Active ; Vocabulary:   SNOMED CT          Diagnoses(Active)    Abdominal pain  Date:   12/05/2020 ; Diagnosis Type:   Reason For Visit ; Confirmation:   Complaint of ; Clinical Dx:   Abdominal pain ; Classification:   Medical ; Clinical Service:   Emergency medicine ; Code:   PNED ; Probability:   0 ; Diagnosis Code:   [id number]              -    Procedure History   (As Of: 12/05/2020 22:13:33 EDT)     Anesthesia Minutes:   0 ; Procedure Name:   Hernia repair ; Procedure Minutes:   0            Anesthesia Minutes:   0 ; Procedure Name:   Tonsils and adenoids ; Procedure Minutes:   0            [facility] Fall Risk Assessment Tool   Hx of falling last 3 months ED Fall :   No   Patient confused or disoriented ED Fall :   No   Patient intoxicated or sedated ED Fall :   No   Patient impaired gait ED Fall :   No   Use a mobility assistance device ED Fall :   No   Patient altered elimination ED Fall :   No   [facility] ED Fall Score :  0    Lendell Caprice RN, Gretchen Short G - 12/05/2020 22:12 EDT   ED Advance Directive   Advance Directive :   No   Lendell Caprice, RN, Neill Loft - 12/05/2020 22:12 EDT   Med Hx   Medication List   (As Of: 12/05/2020 22:13:33 EDT)   Normal Order    iopamidol 61% Inj Soln 100 mL  :   iopamidol 61% Inj Soln 100 mL ; Status:   Ordered ; Ordered As Mnemonic:   Isovue-300 ; Simple Display Line:   100 mL, IV Contrast, Once ; Ordering Provider:   Alfonzo Beers LYN-PA-C; Catalog Code:   iopamidol ; Order Dt/Tm:   12/05/2020 21:53:56 EDT          ondansetron 2 mg/mL Inj Soln 2 mL  :   ondansetron 2 mg/mL Inj Soln 2 mL ; Status:   Completed ; Ordered As Mnemonic:   Zofran ; Simple Display Line:   4 mg, 2 mL, IV Push, Once ; Ordering Provider:   Alfonzo Beers LYN-PA-C; Catalog Code:   ondansetron ; Order Dt/Tm:   12/05/2020 21:53:55 EDT          Sodium Chloride 0.9% intravenous solution Bolus  :   Sodium Chloride 0.9% intravenous solution Bolus ; Status:   Completed ; Ordered As Mnemonic:   Sodium Chloride 0.9% bolus ; Simple Display Line:   1,000 mL, 1000 mL/hr, IV Piggyback, Once ; Ordering Provider:   CONDY,  ALEXANDRA LYN-PA-C; Catalog Code:   Sodium Chloride 0.9% ; Order Dt/Tm:   12/05/2020 21:53:55 EDT            Home Meds    hyoscyamine  :   hyoscyamine ; Status:   Documented ; Ordered As Mnemonic:   hyoscyamine 0.125 mg sublingual tablet ;  Simple Display Line:   0.125 mg, 1 tabs, SL, q4hr, 30 tabs, 0 Refill(s) ; Catalog Code:   hyoscyamine ; Order Dt/Tm:   12/05/2020 22:13:30 EDT          fluticasone nasal  :   fluticasone nasal ; Status:   Documented ; Ordered As Mnemonic:   Flonase ; Simple Display Line:   Daily, 0 Refill(s) ; Catalog Code:   fluticasone nasal ; Order Dt/Tm:   01/18/2019 08:25:59 EST          omeprazole  :   omeprazole ; Status:   Documented ; Ordered As Mnemonic:   omeprazole ; Simple Display Line:   Oral, Daily, 0 Refill(s) ; Catalog Code:   omeprazole ; Order Dt/Tm:   01/18/2019 08:25:54 EST

## 2020-12-05 NOTE — ED Provider Notes (Signed)
Abdominal pain        Patient:   [name], [name]            MRN: [id number]            FIN: [id number]               Age:   40 years     Sex:  Male     DOB:  Aug 03, 1980   Associated Diagnoses:   Constipation in male   Author:   Wilford Sports LYN-PA-C      Basic Information   Time seen: Provider Seen (ST)   ED Provider/Time:    Adylee Leonardo,  Christen Wardrop LYN-PA-C / 12/05/2020 21:38  .   Additional information: Chief Complaint from Nursing Triage Note   Chief Complaint  Chief Complaint: c/o rt flank and LLQ pain x wks, hx of diverticulitis. Some NV but now noted to be constipated (12/05/20 21:42:00).      History of Present Illness   The patient presents with abdominal pain.  The onset was 2  weeks ago.  The course/duration of symptoms is episodic: with multiple episodes.  40 year old Caucasian male with history of diverticulitis presents emergency department for evaluation of generalized abdominal pain and low back pain that has been intermittent over the past 2 weeks.  He states that he initially had the symptoms and was seen by his GI physician who started him on Augmentin for suspected diverticulitis.  He states that a few days his symptoms improved.  He states he has had intermittent pain with nausea for the last week.  States he is now constipated.  Took a dose of MiraLAX and a suppository a few hours prior to arrival.  Denies any fever, chills, vomiting, dysuria or hematuria..        Review of Systems   Constitutional symptoms:  No fever, no chills.    Skin symptoms:  No rash, no abrasions.    Respiratory symptoms:  No shortness of breath, no cough.    Gastrointestinal symptoms:  Abdominal pain, mild, diffuse, nausea, constipation, no vomiting, no diarrhea.    Genitourinary symptoms:  Urinary frequency, no dysuria, no hematuria.              Additional review of systems information: All other systems reviewed and otherwise negative.      Health Status   Allergies:    Allergic Reactions (Selected)  Moderate  Sulfa  drugs- Rash..   Medications:  (Selected)   Inpatient Medications  Ordered  Isovue-300: 100 mL, IV Contrast, Once  Sodium Chloride 0.9% bolus: 1,000 mL, 1000 mL/hr, IV Piggyback, Once  Zofran: 4 mg, 2 mL, IV Push, Once  Documented Medications  Documented  Flonase: Daily, 0 Refill(s)  omeprazole: Oral, Daily, 0 Refill(s).      Past Medical/ Family/ Social History   Medical history: Reviewed as documented in chart.   Surgical history: Reviewed as documented in chart.   Family history: Not significant.   Social history: Reviewed as documented in chart.   Problem list:    Active Problems (3)  Diverticulitis   Ex-smoker   SVT (supraventricular tachycardia)   .      Physical Examination               Vital Signs   Vital Signs   54/08/2701 50:09 EDT Systolic Blood Pressure 381 mmHg  HI    Diastolic Blood Pressure 95 mmHg  HI    Temperature Oral 36.8 degC  Heart Rate Monitored 91 bpm    Respiratory Rate 18 br/min    SpO2 100 %   .   Measurements   12/05/2020 21:44 EDT Body Mass Index est meas 34.29 kg/m2    Body Mass Index Measured 34.29 kg/m2   12/05/2020 21:42 EDT Height/Length Measured 180 cm    Weight Dosing 111.1 kg   .   General:  Alert, no acute distress.    Skin:  Warm, dry, intact.    Head:  Normocephalic, atraumatic.    Neck:  Supple.   Eye:  Normal conjunctiva.   Cardiovascular:  Regular rate and rhythm.   Respiratory:  Lungs are clear to auscultation, respirations are non-labored.    Gastrointestinal:  Soft, Tenderness: Mild, generalized, Guarding: Negative, Rebound: Negative.    Neurological:  Alert and oriented to person, place, time, and situation, normal speech observed.    Psychiatric:  Cooperative, appropriate mood & affect.       Medical Decision Making   Results review:  Lab results : Lab View   12/05/2020 22:32 EDT Estimated Creatinine Clearance 88.73 mL/min   12/05/2020 22:11 EDT Appear U POC Clear    Color U POC Yellow    Bili U POC Negative    Blood U POC Negative    Glucose U POC Negative mg/dL     Ketones U POC Negative mg/dL    Leuk Est U POC Negative    Nitrite U POC Negative    pH U POC 7.0    Protein U POC Negative    Spec Grav U POC 1.020    Urobilin U POC 0.2 EU/dL   12/05/2020 22:02 EDT WBC 8.0 x10e3/mcL    RBC 4.68 x10e6/mcL    Hgb 14.9 g/dL    HCT 43.3 %    MCV 92.5 fL    MCH 31.8 pg    MCHC 34.4 g/dL    RDW 12.1 %    Platelet 164 x10e3/mcL    MPV 11.5 fL    Neutro Auto 56.1 %    Neutro Absolute 4.5 x10e3/mcL    Immature Grans Percent 0.2 %    Immature Grans Absolute 0.02 x10e3/mcL    Lymph Auto 32.7 %    Lymph Absolute 2.6 x10e3/mcL    Mono Auto 9.1 %    Mono Absolute 0.7 x10e3/mcL    Eosinophil Percent 1.5 %    Eos Absolute 0.1 x10e3/mcL    Basophil Auto 0.4 %    Baso Absolute 0.0 x10e3/mcL    Sodium Lvl 141 mmol/L    Potassium Lvl 4.2 mmol/L    Chloride 104 mmol/L    CO2 29 mmol/L    Glucose Random 104 mg/dL  HI    BUN 17 mg/dL    Creatinine Lvl 1.4 mg/dL  HI    AGAP 8 mmol/L    Osmolality Calc 283 mOsm/kg    Calcium Lvl 9.4 mg/dL    Protein Total 6.9 g/dL    Albumin Lvl 4.6 g/dL    Alk Phos 82 unit/L    AST 23 unit/L    ALT 38 unit/L    eGFR 65 mL/min/1.41m???  LOW    Bili Total 0.34 mg/dL    Bili Direct <0.20 mg/dL    Lipase Lvl 49 unit/L   12/05/2020 21:44 EDT Estimated Creatinine Clearance 124.22 mL/min   .   Radiology results:  Interpretation:  Preliminary radiology report  Impression: No acute intra-abdominal pathology.  No bowel obstruction or inflammation.  Appendix is normal.  No hydronephrosis or nephrolithiasis.  No free air or free fluid.  Moderate bladder wall thickening in an ant.  Distended bladder, which may be due to to underdistention or cystitis.  Mild right convex lumbar curvature.   Notes:  Presents for intermittent abdominal pain and nausea over the last 2 weeks  History of diverticulitis  Follows with [facility]  All vital signs here in ED  CBC unremarkable  Chemistries with isolated elevated creatinine.  Hydrated with 1 L normal saline here in ED  Urinalysis with no  evidence of infection  CT abdomen pelvis shows no acute abnormality  Discussed bowel cleanse for constipation  Stable for outpatient follow-up with GI.  Return to ED precautions given..      Reexamination/ Reevaluation   Time: 12/05/2020 23:20:00 .   Course: improving.   Notes: Resting comfortably.  Discussed reassuring work-up.  Discussed bowel cleanse..      Impression and Plan   Diagnosis   Constipation in male (ICD10-CM K59.00, Discharge, Medical)   Plan   Condition: Stable.    Disposition: Discharged: Time  12/05/2020 23:16:00, to home.    Patient was given the following educational materials: Constipation, Adult mirilax bowel prep/cleanout ([id number]).    Follow up with: [facility] Within 1 week Today your work-up here in the emergency department is reassuring  Take MiraLAX for bowel clean out  Follow-up with [facility] at first available appointment  Return to the emergency department immediately for any new or worsening symptoms.    Counseled: Patient, Regarding diagnosis, Regarding diagnostic results, Regarding treatment plan, Patient indicated understanding of instructions.      Signature Line     Electronically Signed on 12/05/2020 11:23 PM EDT   ________________________________________________   Wilford Sports LYN-PA-C      Electronically Signed on 12/05/2020 11:58 PM EDT   ________________________________________________   Milon Dikes SCOTT-MD            Modified by: Wilford Sports LYN-PA-C on 12/05/2020 11:19 PM EDT      Modified by: Wilford Sports LYN-PA-C on 12/05/2020 11:23 PM EDT

## 2020-12-05 NOTE — ED Notes (Signed)
 ED Patient Summary                 [facility]  [address], [place], [place] [address]  [phone]  Discharge Instructions (Patient)  Name:[name], [name]  DOB:  [birth year]                   MRN: [id number]                   FIN: [id number]  Reason For Visit: Abdominal pain; ABDOMINAL PAIN  Final Diagnosis: Constipation in male     Visit Date: 12/05/2020 21:35:00  Address: [address] [place] [address]  Phone: [phone]     Emergency Department Providers:         Primary Physician:      DANETTE KAST LYN      [facility] would like to thank you for allowing us  to assist you with your healthcare needs. The following includes patient education materials and information regarding your injury/illness.     Follow-up Instructions:  You were seen today on an emergency basis. Please contact your primary care doctor for a follow up appointment. If you received a referral to a specialist doctor, it is important you follow-up as instructed.    It is important that you call your follow-up doctor to schedule and confirm the location of your next appointment. Your doctor may practice at multiple locations. The office location of your follow-up appointment may be different to the one written on your discharge instructions.    If you do not have a primary care doctor, please call [phone] for help in finding a [name] Provider. For help in finding a specialist doctor, please call [phone].    If your condition gets worse before your follow-up with your primary care doctor or specialist, please return to the Emergency Department.      Coronavirus 2019 (COVID-19) Reminders:     Patients age 28 - 79, with parental consent, and over age 6 can make an appointment for a COVID-19 vaccine. Patients can contact their [facility] doctors' offices to schedule an appointment to receive the COVID-19 vaccine. Patients who  do not have a [facility] physician can call [phone] to schedule vaccination appointments.      Follow Up Appointments:  Primary Care Provider:     Name: PATTON DOMINO M-MD     Phone: [phone]                 With: Address: When:   [facility] [address], [address] [place], [place] [address]  [phone] Business (1) Within 1 week   Comments:   Today your work-up here in the emergency department is reassuring   Take MiraLAX for bowel clean out   Follow-up with [facility] at first available appointment   Return to the emergency department immediately for any new or worsening symptoms              [facility] SERVICES%>             Medications that have not changed  Other Medications  fluticasone nasal (Flonase) every day.  Last Dose:____________________  hyoscyamine (hyoscyamine 0.125 mg sublingual tablet) 1 Tabs Sublingual (dissolve under the tongue) every 4 hours.  Last Dose:____________________  omeprazole  Oral (given by mouth) every day.  Last Dose:____________________  Allergy Info: sulfa drugs     Discharge Additional Information          Discharge Patient 12/05/20 23:20:00 EDT      Patient Education Materials:             Constipation, Adult  Constipation is when a person has fewer than three bowel movements in a week, has difficulty having a bowel movement, or has stools (feces) that are dry, hard, or larger than normal. Constipation may be caused by an underlying condition. It may become worse with age if a person takes certain medicines and does not take in enough fluids.  Follow these instructions at home:  Eating and drinking    . Eat foods that have a lot of fiber, such as beans, whole grains, and fresh fruits and vegetables.  . Limit foods that are low in fiber and high in fat and processed sugars, such as fried or sweet foods. These include french fries, hamburgers, cookies, candies, and  soda.  . Drink enough fluid to keep your urine pale yellow.  General instructions  . Exercise regularly or as told by your health care provider. Try to do 150 minutes of moderate exercise each week.  . Use the bathroom when you have the urge to go. Do not hold it in.  . Take over-the-counter and prescription medicines only as told by your health care provider. This includes any fiber supplements.  . During bowel movements:   ? Practice deep breathing while relaxing the lower abdomen.  ? Practice pelvic floor relaxation.  . Watch your condition for any changes. Let your health care provider know about them.  SABRA Keep all follow-up visits as told by your health care provider. This is important.  Contact a health care provider if:  . You have pain that gets worse.  . You have a fever.  . You do not have a bowel movement after 4 days.  . You vomit.  . You are not hungry or you lose weight.  . You are bleeding from the opening between the buttocks (anus).  . You have thin, pencil-like stools.  Get help right away if:  . You have a fever and your symptoms suddenly get worse.  . You leak stool or have blood in your stool.  . Your abdomen is bloated.  . You have severe pain in your abdomen.  . You feel dizzy or you faint.  Summary  . Constipation is when a person has fewer than three bowel movements in a week, has difficulty having a bowel movement, or has stools (feces) that are dry, hard, or larger than normal.  . Eat foods that have a lot of fiber, such as beans, whole grains, and fresh fruits and vegetables.  . Drink enough fluid to keep your urine pale yellow.  . Take over-the-counter and prescription medicines only as told by your health care provider. This includes any fiber supplements.      Bowel cleanout regimen:    Begin a liquid diet with only liquids for the rest of the day.  Take 2 Dulcolax (bisacodyl) tablets. (DO NOT CHEW.) before bedtime.    In the morning:  Mix the entire 238-gram bottle of MiraLAX with 64  ounces of a sports drink.  Drink all of the mixture over the next few hours until gone. (Suggestion: An 8-ounce glass every 15-30 minutes equals 2-4 hours.)  It is very important to drink plenty of water and other liquids in order to avoid  dehydration and to flush the bowel. (Although alcohol is a liquid, it can make you dehydrated. You should NOT drink alcohol while doing the cleanout.)  NOTE: Please stay home once you have started your cleanout. Also, the use of moist towelettes or wipes may help to minimize discomfort during the cleanout. A nonprescription 1% hydrocortisone cream may also be soothing when applied to the rectal area after each bowel movement.  It is common during the cleanout to experience some nausea, bloating, and/or abdominal distention. If you chilled the mixture prior to drinking it, you could experience chills from consuming so much cold liquid in a short time period. If you develop nausea or vomiting, slow down the rate at which you drink the solution. Please attempt to drink all of the laxative solution even if it takes you longer.  Once stooling slows down, you may resume eating solid food.    ---------------------------------------------------------------------------------------------------------------------  [facility] allows patients to review your COVID and other test results as well as discharge documents from any [name], Emergency Department, surgical center or outpatient lab. Test results are typically available 36 hours after the test is completed.     [facility] encourages you to self-enroll in the [facility] Patient Portal.     To begin your self-enrollment process, please visit [email or web address] Under [facility], click on "Sign up now".     NOTE: You must be 16 years and older to use [facility] Self-Enroll online. If you are a parent, caregiver, or guardian; you need an invite to access your  child's or dependent's health records. To obtain an invite, contact the Medical Records department at [phone] Monday through Friday, 8-4:30, select option 3 . If we receive your call afterhours, we will return your call the next business day.     If you have issues trying to create or access your account, contact [employer] support at [phone] available 7 days a week 24 hours a day.     Comment:

## 2020-12-05 NOTE — ED Notes (Signed)
ED Triage Note       ED Triage Adult Entered On:  12/05/2020 21:44 EDT    Performed On:  12/05/2020 21:42 EDT by Denita Lung L-RN               Triage   Numeric Rating Pain Scale :   10 = Worst possible pain   Chief Complaint :   c/o rt flank and LLQ pain x wks, hx of diverticulitis. Some NV but now noted to be constipated   [place] Mode of Arrival :   Private vehicle   Infectious Disease Documentation :   Document assessment   Temperature Oral :   36.8 degC(Converted to: 98.2 degF)    Heart Rate Monitored :   91 bpm   Respiratory Rate :   18 br/min   Systolic Blood Pressure :   167 mmHg (HI)    Diastolic Blood Pressure :   95 mmHg (HI)    SpO2 :   100 %   Patient presentation :   None of the above   Chief Complaint or Presentation suggest infection :   No   Weight Dosing :   111.1 kg(Converted to: 244 lb 15 oz)    Height :   180 cm(Converted to: 5 ft 11 in)    Body Mass Index Dosing :   34 kg/m2   Denita Lung L-RN - 12/05/2020 21:42 EDT   DCP GENERIC CODE   Tracking Acuity :   3   Tracking Group :   ED [employer]   Denita Lung L-RN - 12/05/2020 21:42 EDT   ED General Section :   Document assessment   Pregnancy Status :   N/A   ED Allergies Section :   Document assessment   ED Reason for Visit Section :   Document assessment   ED Quick Assessment :   Patient appears awake, alert, oriented to baseline. Skin warm and dry. Moves all extremities. Respiration even and unlabored. Appears in no apparent distress.   Cornell,  Tiffany L-RN - 12/05/2020 21:42 EDT   ID Risk Screen Symptoms   Recent Travel History :   No recent travel   TB Symptom Screen :   No symptoms   Last 90 days COVID-19 ID :   No   Cornell,  Tiffany L-RN - 12/05/2020 21:42 EDT   Allergies   (As Of: 12/05/2020 21:44:35 EDT)   Allergies (Active)   sulfa drugs  Estimated Onset Date:   Unspecified ; Reactions:   rash ; Created By:   Donivan Scull RN, Anderson Malta; Reaction Status:   Active ; Category:   Drug ; Substance:   sulfa drugs ; Type:    Allergy ; Severity:   Moderate ; Updated By:   Donivan Scull RN, Anderson Malta; Reviewed Date:   06/29/2020 15:23 EDT        Psycho-Social   Last 3 mo, thoughts killing self/others :   Patient denies   Right click within box for Suspected Abuse policy link. :   None   Feels Safe Where Live :   Yes   ED Behavioral Activity Rating Scale :   4 - Quiet and awake (normal level of activity)   Denita Lung L-RN - 12/05/2020 21:42 EDT   ED Reason for Visit   (As Of: 12/05/2020 21:44:35 EDT)   Problems(Active)    Diverticulitis (SNOMED CT  :416606301 )  Name of Problem:   Diverticulitis ; Recorder:  Ace, RN, Anderson Malta; Confirmation:   Confirmed ; Classification:   Patient Stated ; Code:   [id number] ; Contributor System:   Dietitian ; Last Updated:   01/18/2019 8:24 EST ; Life Cycle Date:   01/18/2019 ; Life Cycle Status:   Active ; Vocabulary:   SNOMED CT        Ex-smoker (SNOMED CT  :99983332 )  Name of Problem:   Ex-smoker ; Recorder:   Ace, RN, Anderson Malta; Confirmation:   Confirmed ; Classification:   Patient Stated ; Code:   [id number] ; Contributor System:   PowerChart ; Last Updated:   01/18/2019 8:25 EST ; Life Cycle Date:   01/18/2019 ; Life Cycle Status:   Active ; Vocabulary:   SNOMED CT        SVT (supraventricular tachycardia) (SNOMED CT  :20162222 )  Name of Problem:   SVT (supraventricular tachycardia) ; Recorder:   Ace, RN, Anderson Malta; Confirmation:   Confirmed ; Classification:   Patient Stated ; Code:   [id number] ; Contributor System:   PowerChart ; Last Updated:   01/18/2019 8:24 EST ; Life Cycle Date:   01/18/2019 ; Life Cycle Status:   Active ; Vocabulary:   SNOMED CT          Diagnoses(Active)    Abdominal pain  Date:   12/05/2020 ; Diagnosis Type:   Reason For Visit ; Confirmation:   Complaint of ; Clinical Dx:   Abdominal pain ; Classification:   Medical ; Clinical Service:   Emergency medicine ; Code:   PNED ; Probability:   0 ; Diagnosis Code:   [id number]

## 2020-12-06 LAB — HEPATIC FUNCTION PANEL
ALT: 38 U/L (ref 0–50)
AST: 23 U/L (ref 0–50)
Albumin: 4.6 g/dL (ref 3.5–5.2)
Alk Phosphatase: 82 U/L (ref 40–130)
Bilirubin, Direct: <0.2 mg/dL (ref 0.00–0.30)
Total Bilirubin: 0.34 mg/dL (ref 0.00–1.20)
Total Protein: 6.9 g/dL (ref 6.4–8.3)

## 2020-12-06 LAB — CBC WITH AUTO DIFFERENTIAL
Absolute Baso #: 0 10*3/uL (ref 0.0–0.2)
Absolute Eos #: 0.1 10*3/uL (ref 0.0–0.5)
Absolute Lymph #: 2.6 10*3/uL (ref 1.0–3.2)
Absolute Mono #: 0.7 10*3/uL (ref 0.3–1.0)
Basophils %: 0.4 % (ref 0.0–2.0)
Eosinophils %: 1.5 % (ref 0.0–7.0)
Hematocrit: 43.3 % (ref 38.0–52.0)
Hemoglobin: 14.9 g/dL (ref 13.0–17.3)
Immature Grans (Abs): 0.02 10*3/uL (ref 0.00–0.06)
Immature Granulocytes: 0.2 % (ref 0.0–0.6)
Lymphocytes: 32.7 % (ref 15.0–45.0)
MCH: 31.8 pg (ref 27.0–34.5)
MCHC: 34.4 g/dL (ref 32.0–36.0)
MCV: 92.5 fL (ref 84.0–100.0)
MPV: 11.5 fL (ref 7.2–13.2)
Monocytes: 9.1 % (ref 4.0–12.0)
Neutrophils %: 56.1 % (ref 42.0–74.0)
Neutrophils Absolute: 4.5 10*3/uL (ref 1.6–7.3)
Platelets: 164 10*3/uL (ref 140–440)
RBC: 4.68 x10e6/mcL (ref 4.00–5.60)
RDW: 12.1 % (ref 11.0–16.0)
WBC: 8 10*3/uL (ref 3.8–10.6)

## 2020-12-06 LAB — POC URINALYSIS, CHEMISTRY
Bilirubin, Urine, POC: NEGATIVE
Blood, UA POC: NEGATIVE
Glucose, UA POC: NEGATIVE mg/dL
Ketones, Urine, POC: NEGATIVE mg/dL
Leukocytes, UA: NEGATIVE
Nitrate, UA POC: NEGATIVE
Protein, Urine, POC: NEGATIVE
Specific Gravity, Urine, POC: 1.02 (ref 1.003–1.035)
UROBILIN U POC: 0.2 EU/dL
pH, Urine, POC: 7 (ref 4.5–8.0)

## 2020-12-06 LAB — BASIC METABOLIC PANEL
Anion Gap: 8 mmol/L (ref 2–17)
BUN: 17 mg/dL (ref 6–20)
CO2: 29 mmol/L (ref 22–29)
Calcium: 9.4 mg/dL (ref 8.6–10.0)
Chloride: 104 mmol/L (ref 98–107)
Creatinine: 1.4 mg/dL — ABNORMAL HIGH (ref 0.7–1.3)
Est, Glom Filt Rate: 65 mL/min/1.73m?? — ABNORMAL LOW (ref >=90)
Glucose: 104 mg/dL — ABNORMAL HIGH (ref 70–99)
OSMOLALITY CALCULATED: 283 mOsm/kg (ref 270–287)
Potassium: 4.2 mmol/L (ref 3.5–5.3)
Sodium: 141 mmol/L (ref 135–145)

## 2020-12-06 LAB — LIPASE: Lipase: 49 U/L (ref 13–60)

## 2021-01-09 ENCOUNTER — Ambulatory Visit
Admit: 2021-01-09 | Discharge: 2021-01-09 | Payer: PRIVATE HEALTH INSURANCE | Attending: Family Medicine | Primary: Family Medicine

## 2021-01-09 DIAGNOSIS — Z23 Encounter for immunization: Secondary | ICD-10-CM

## 2021-01-09 NOTE — Patient Instructions (Signed)
Well Visit, Ages 18 to 65: Care Instructions  Well visits can help you stay healthy. Your doctor has checked your overall health and may have suggested ways to take good care of yourself. Your doctor also may have recommended tests. You can help prevent illness with healthy eating, good sleep, vaccinations, regular exercise, and other steps.  Get the tests that you and your doctor decide on. Depending on your age and risks, examples might include screening for diabetes; hepatitis C; HIV; and cervical, breast, lung, and colon cancer. Screening helps find diseases before any symptoms appear.  Eat healthy foods. Choose fruits, vegetables, whole grains, lean protein, and low-fat dairy foods. Limit saturated fat and reduce salt.  Limit alcohol. Men should have no more than 2 drinks a day. Women should have no more than 1. For some people, no alcohol is the best choice.  Exercise. Get at least 30 minutes of exercise on most days of the week. Walking can be a good choice.  Reach and stay at your healthy weight. This will lower your risk for many health problems.  Take care of your mental health. Try to stay connected with friends, family, and community, and find ways to manage stress.  If you're feeling depressed or hopeless, talk to someone. A counselor can help. If you don't have a counselor, talk to your doctor.  Talk to your doctor if you think you may have a problem with alcohol or drug use. This includes prescription medicines and illegal drugs.  Avoid tobacco and nicotine: Don't smoke, vape, or chew. If you need help quitting, talk to your doctor.  Practice safer sex. Getting tested, using condoms or dental dams, and limiting sex partners can help prevent STIs.  Use birth control if it's important to you to prevent pregnancy. Talk with your doctor about your choices and what might be best for you.  Prevent problems where you can. Protect your skin from too much sun, wash your hands, brush your teeth twice a  day, and wear a seat belt in the car.  Where can you learn more?  Go to [email or web address] and sign in to your MyChart account. Enter P072 in the Search Health Information box to learn more about "Well Visit, Ages 18 to 65: Care Instructions."     If you do not have an account, please click on the "Sign Up Now" link.  Current as of: May 10, 2020??????????????????????????????Content Version: 13.4  ?? 2006-2022 [employer].   Care instructions adapted under license by [facility]. If you have questions about a medical condition or this instruction, always ask your healthcare professional. [employer], Inc[employer] disclaims any warranty or liability for your use of this information.

## 2021-01-09 NOTE — Progress Notes (Signed)
Well Adult Note  Name: [name] Today???s Date: 01/09/2021   MRN: [id number] Sex: Male   Age: 40 y.o. Ethnicity: Non-Hispanic / Non Latino   DOB: 12-26-1980 Race: White (non-Hispanic)      [name] is here for well adult exam.  History:  Patient here for wellness exam.     States he has been doing well. Has been making effort to eat healthy and be active. Has been going to gym.     Has had issues with constipation. Has been seeing GI and has been rx linzess. Notes he has abdominal pain and cramping. States linzess has been giving diarrhea.     Notes he wakes up tired. Does snore. Has no energy.     Review of Systems    ROS as per HPI or otherwise negative.       Allergies   Allergen Reactions    Sulfa Antibiotics Rash         Prior to Visit Medications    Medication Sig Taking? Authorizing Provider   hyoscyamine (LEVSIN/SL) 125 MCG sublingual tablet  Yes Pricilla Riffle, MD   LINZESS 290 MCG CAPS capsule 1 tablet once a day Yes Historical Provider, MD   omeprazole (PRILOSEC) 40 MG delayed release capsule Take 40 mg by mouth daily Yes Historical Provider, MD   fluticasone (FLONASE) 50 MCG/ACT nasal spray 1 spray in each nostril Nasally Once a day for 30 day(s) Yes Rsfh Rsfh Automatic Reconciliation, MD         Past Medical History:   Diagnosis Date    Allergic rhinitis     GERD (gastroesophageal reflux disease)     Seasonal allergies     SVT (supraventricular tachycardia) (HCC)        Past Surgical History:   Procedure Laterality Date    HERNIA REPAIR      TONSILLECTOMY AND ADENOIDECTOMY      WISDOM TOOTH EXTRACTION           Family History   Problem Relation Age of Onset    Diabetes Father     Cancer Maternal Grandmother        Social History     Tobacco Use    Smoking status: Former     Packs/day: 1.00     Years: 15.00     Pack years: 15.00     Types: Cigarettes     Quit date: 2013     Years since quitting: 9.8    Smokeless tobacco: Never   Vaping Use    Vaping Use: Never used   Substance Use Topics     Alcohol use: Yes    Drug use: Never       Objective   BP 132/80    Pulse 73    Ht 5\' 10"  (1.778 m)    Wt 240 lb 12.8 oz (109.2 kg)    SpO2 98%    BMI 34.55 kg/m??   Wt Readings from Last 3 Encounters:   01/09/21 240 lb 12.8 oz (109.2 kg)     There were no vitals filed for this visit.      Physical Exam     GENERAL: The patient is in no apparent distress. Alert and oriented. Vital Signs Reviewed HEENT: Head is normocephalic and atraumatic. Extraocular muscles are intact. Pupils are equal, Conjunctiva normal. Moist Mucous membranes. Posterior pharynx clear of any exudate or lesions. NECK: Supple. No Lymphadenopathy LUNGS: Clear to auscultation bilaterally. Breath Sounds equal. HEART: Regular rate  and rhythm without murmur. No edema ABDOMEN: Soft, nontender, and nondistended. . Musculoskeletal: No deformity. Gait WNL. No swelling noted. NEUROLOGIC: Alert and Oriented. No focal deficits. PSYCHIATRIC: Cooperative, mood appropriate. SKIN: No rash noted.       Assessment   Plan   1. Flu vaccine need  -     Influenza, FLUARIX, (age 9 mo+),  IM, Preservative Free, 0.5 mL  2. Encounter for well adult exam without abnormal findings  -     CBC with Auto Differential; Future  -     Comprehensive Metabolic Panel; Future  -     Lipid Panel; Future  -     TSH with Reflex; Future  -     Hemoglobin A1C; Future  -     Routine Venipuncture (30160)  3. Class 1 obesity due to excess calories without serious comorbidity with body mass index (BMI) of 34.0 to 34.9 in adult  -     CBC with Auto Differential; Future  -     Comprehensive Metabolic Panel; Future  -     Lipid Panel; Future  -     TSH with Reflex; Future  -     Hemoglobin A1C; Future  -     Routine Venipuncture (10932)  4. Weight gain  -     Testosterone, Free/Tot Equilib; Future  -     Routine Venipuncture (35573)  5. Snoring  6. Daytime sleepiness     Patient  struggling with weight and constipation. Blood pressure  in reasonable range. . Check routine labs and labs to eval  complaints. Discussed trial of miralax instead of linzess since having diarrhea with linzess. Discussed having sleep study-- agreeable to home sleep study.  Encouraged healthy eating and physical activity efforts. Keep specialist appointments as planned.Has a GI follow-up in December.          Return in about 1 year (around 01/09/2022) for CPE.

## 2021-01-10 LAB — CBC WITH AUTO DIFFERENTIAL
Absolute Baso #: 0 10*3/uL (ref 0.0–0.2)
Absolute Eos #: 0.1 10*3/uL (ref 0.0–0.5)
Absolute Lymph #: 1.5 10*3/uL (ref 1.0–3.2)
Absolute Mono #: 0.6 10*3/uL (ref 0.3–1.0)
Basophils %: 0.5 % (ref 0.0–2.0)
Eosinophils %: 1.2 % (ref 0.0–7.0)
Hematocrit: 44 % (ref 38.0–52.0)
Hemoglobin: 15.3 g/dL (ref 13.0–17.3)
Immature Grans (Abs): 0.03 10*3/uL (ref 0.00–0.06)
Immature Granulocytes: 0.5 % (ref 0.0–0.6)
Lymphocytes: 24.7 % (ref 15.0–45.0)
MCH: 31.4 pg (ref 27.0–34.5)
MCHC: 34.8 g/dL (ref 32.0–36.0)
MCV: 90.3 fL (ref 84.0–100.0)
MPV: 11.9 fL (ref 7.2–13.2)
Monocytes: 10.3 % (ref 4.0–12.0)
NRBC Absolute: 0 10*3/uL (ref 0.000–0.012)
NRBC Automated: 0 % (ref 0.0–0.2)
Neutrophils %: 62.8 % (ref 42.0–74.0)
Neutrophils Absolute: 3.7 10*3/uL (ref 1.6–7.3)
Platelets: 187 10*3/uL (ref 140–440)
RBC: 4.87 x10e6/mcL (ref 4.00–5.60)
RDW: 13 % (ref 11.0–16.0)
WBC: 6 10*3/uL (ref 3.8–10.6)

## 2021-01-10 LAB — HEMOGLOBIN A1C
Est. Avg. Glucose, WB: 108
Est. Avg. Glucose-calculated: 115
Hemoglobin A1C: 5.4 % (ref 4.0–6.0)

## 2021-01-10 LAB — LIPID PANEL
Chol/HDL Ratio: 4.4 (ref 0.0–4.4)
Cholesterol: 234 mg/dL — ABNORMAL HIGH (ref 100–200)
HDL: 53 mg/dL (ref >=40)
LDL Cholesterol: 141.6 mg/dL — ABNORMAL HIGH (ref 0.0–100.0)
LDL/HDL Ratio: 2.7
Triglycerides: 197 mg/dL — ABNORMAL HIGH (ref 0–149)
VLDL: 39.4 mg/dL (ref 5.0–40.0)

## 2021-01-10 LAB — COMPREHENSIVE METABOLIC PANEL
ALT: 36 U/L (ref 0–50)
AST: 28 U/L (ref 0–50)
Albumin/Globulin Ratio: 2.2 (ref 1.00–2.70)
Albumin: 4.9 g/dL (ref 3.5–5.2)
Alk Phosphatase: 87 U/L (ref 40–130)
Anion Gap: 12 mmol/L (ref 2–17)
BUN: 11 mg/dL (ref 6–20)
CO2: 26 mmol/L (ref 22–29)
Calcium: 9.8 mg/dL (ref 8.6–10.0)
Chloride: 103 mmol/L (ref 98–107)
Creatinine: 1.1 mg/dL (ref 0.7–1.3)
Est, Glom Filt Rate: 87 mL/min/1.73m?? — ABNORMAL LOW (ref >=90)
Globulin: 2.2 g/dL (ref 1.9–4.4)
Glucose: 94 mg/dL (ref 70–99)
OSMOLALITY CALCULATED: 280 mOsm/kg (ref 270–287)
Potassium: 4.2 mmol/L (ref 3.5–5.3)
Sodium: 141 mmol/L (ref 135–145)
Total Bilirubin: 0.45 mg/dL (ref 0.00–1.20)
Total Protein: 7.1 g/dL (ref 6.4–8.3)

## 2021-01-10 LAB — TSH WITH REFLEX: TSH: 1.11 mcIU/mL (ref 0.358–3.740)

## 2021-01-10 NOTE — Progress Notes (Signed)
Home sleep study ordered through [employer], form filled out and faxed up front along with note and demographics.

## 2021-01-13 LAB — TESTOSTERONE, FREE/TOT EQUILIB
Testosterone % Free: 3.65 % (ref 1.50–4.20)
Testosterone, Free: 10.07 ng/dL (ref 5.00–21.00)
Testosterone: 276 ng/dL (ref 264–916)

## 2021-01-16 NOTE — Telephone Encounter (Signed)
From: Lenoria Chime  To: Dr. Gabriel Cirri  Sent: 01/16/2021 10:27 AM EST  Subject: Insurance Form    Hello Dr Laurence Compton. I need to get a wellness screening form signed for my insurance company in regards to my recent physical.   Can I email this to you and have it filled out?   I have it attached here. Can you please fill out and fax over?

## 2021-01-22 ENCOUNTER — Encounter

## 2021-01-22 MED ORDER — OMEPRAZOLE 40 MG PO CPDR
40 MG | ORAL_CAPSULE | Freq: Every day | ORAL | 1 refills | Status: DC
Start: 2021-01-22 — End: 2022-01-06

## 2021-01-22 NOTE — Telephone Encounter (Signed)
I do not see sleep study results. If he just recently did, results may not be back yet. Can you check on this. Thanks.

## 2021-01-22 NOTE — Telephone Encounter (Signed)
Called [employer] to check on results she said it was faxed this am but not going through. I did advise of issues with [place] receiving faxes currently. She will refax two separate ways and if we dont receive to call her back

## 2021-01-22 NOTE — Telephone Encounter (Signed)
From: Lenoria Chime  To: Dr. Gabriel Cirri  Sent: 01/22/2021 10:34 AM EST  Subject: Omeprazole Refill - [facility]    Hello Dr. Laurence Compton.the pharmacy tells me that my omeprazole script has expired. Can you please send an updated prescription to [facility]?    I did the 2 night sleep test. Have you received my results? Do I have to mail the little probe back to them?

## 2021-01-23 NOTE — Telephone Encounter (Signed)
Spoke with [employer] again and requested report to be emailed- received and scanned into chart and sent to MD for review

## 2021-01-23 NOTE — Progress Notes (Signed)
CPAP form filled out and faxed along with demographics and chart notes and copy of sleep study up front

## 2021-11-02 ENCOUNTER — Encounter: Admit: 2021-11-02 | Discharge: 2021-11-02 | Payer: PRIVATE HEALTH INSURANCE | Primary: Family Medicine

## 2021-11-02 DIAGNOSIS — K5792 Diverticulitis of intestine, part unspecified, without perforation or abscess without bleeding: Secondary | ICD-10-CM

## 2021-11-03 LAB — COMPREHENSIVE METABOLIC PANEL
ALT: 31 U/L (ref 0–50)
AST: 18 U/L (ref 0–50)
Albumin/Globulin Ratio: 2.5 (ref 1.00–2.70)
Albumin: 4.9 g/dL (ref 3.5–5.2)
Alk Phosphatase: 72 U/L (ref 40–130)
Anion Gap: 9 mmol/L (ref 2–17)
BUN: 14 mg/dL (ref 6–20)
CO2: 27 mmol/L (ref 22–29)
Calcium: 9.7 mg/dL (ref 8.6–10.0)
Chloride: 103 mmol/L (ref 98–107)
Creatinine: 1 mg/dL (ref 0.7–1.3)
Est, Glom Filt Rate: 97 mL/min/1.73m (ref >=60)
Globulin: 2 g/dL (ref 1.9–4.4)
Glucose: 106 mg/dL — ABNORMAL HIGH (ref 70–99)
OSMOLALITY CALCULATED: 278 mOsm/kg (ref 270–287)
Potassium: 4.3 mmol/L (ref 3.5–5.3)
Sodium: 139 mmol/L (ref 135–145)
Total Bilirubin: 0.57 mg/dL (ref 0.00–1.20)
Total Protein: 6.9 g/dL (ref 6.4–8.3)

## 2021-11-03 LAB — CBC WITH AUTO DIFFERENTIAL
Absolute Baso #: 0 10*3/uL (ref 0.0–0.2)
Absolute Eos #: 0.1 10*3/uL (ref 0.0–0.5)
Absolute Lymph #: 1.7 10*3/uL (ref 1.0–3.2)
Absolute Mono #: 0.6 10*3/uL (ref 0.3–1.0)
Basophils %: 0.5 % (ref 0.0–2.0)
Eosinophils %: 1.1 % (ref 0.0–7.0)
Hematocrit: 44.4 % (ref 38.0–52.0)
Hemoglobin: 15 g/dL (ref 13.0–17.3)
Immature Grans (Abs): 0.02 10*3/uL (ref 0.00–0.06)
Immature Granulocytes: 0.3 % (ref 0.0–0.6)
Lymphocytes: 28 % (ref 15.0–45.0)
MCH: 30.9 pg (ref 27.0–34.5)
MCHC: 33.8 g/dL (ref 32.0–36.0)
MCV: 91.5 fL (ref 84.0–100.0)
MPV: 12.4 fL (ref 7.2–13.2)
Monocytes: 9.3 % (ref 4.0–12.0)
NRBC Absolute: 0 10*3/uL (ref 0.000–0.012)
NRBC Automated: 0 % (ref 0.0–0.2)
Neutrophils %: 60.8 % (ref 42.0–74.0)
Neutrophils Absolute: 3.7 10*3/uL (ref 1.6–7.3)
Platelets: 181 10*3/uL (ref 140–440)
RBC: 4.85 x10e6/mcL (ref 4.00–5.60)
RDW: 12.7 % (ref 11.0–16.0)
WBC: 6.1 10*3/uL (ref 3.8–10.6)

## 2021-11-23 ENCOUNTER — Telehealth

## 2021-11-23 NOTE — Telephone Encounter (Signed)
Referral placed

## 2021-11-23 NOTE — Telephone Encounter (Signed)
Patient has a referral request   Referral to which specialty? Cardiologist   Reason for referral? SBT   Is there a preference for who you want to see or go?[facility]

## 2021-12-07 ENCOUNTER — Ambulatory Visit
Admit: 2021-12-07 | Discharge: 2021-12-07 | Payer: PRIVATE HEALTH INSURANCE | Attending: Internal Medicine | Primary: Family Medicine

## 2021-12-07 DIAGNOSIS — I471 Supraventricular tachycardia, unspecified: Secondary | ICD-10-CM

## 2021-12-07 MED ORDER — PROPRANOLOL HCL 10 MG PO TABS
10 MG | ORAL_TABLET | Freq: Three times a day (TID) | ORAL | 3 refills | Status: AC | PRN
Start: 2021-12-07 — End: ?

## 2021-12-07 NOTE — Progress Notes (Signed)
Review of Systems   Constitutional:  Positive for unexpected weight change. Negative for chills, fatigue and fever.   HENT:  Negative for hearing loss, nosebleeds, sore throat, tinnitus and voice change.    Eyes:  Negative for visual disturbance.   Respiratory:  Negative for apnea, cough, chest tightness, shortness of breath and wheezing.    Cardiovascular:  Negative for chest pain, palpitations and leg swelling.   Gastrointestinal:  Negative for anal bleeding, blood in stool, constipation and diarrhea.   Endocrine: Negative for polydipsia.   Genitourinary:  Negative for difficulty urinating, dysuria, hematuria and urgency.   Musculoskeletal:  Negative for myalgias.   Skin:  Negative for rash and wound.   Neurological:  Negative for dizziness, syncope, weakness, light-headedness and numbness.   Hematological:  Does not bruise/bleed easily.   Psychiatric/Behavioral:  Negative for sleep disturbance.

## 2021-12-07 NOTE — Progress Notes (Signed)
Date:  December 07, 2021  Patient name: [name]  Date of Birth: May 03, 1980      CARDIOLOGY NEW PATIENT EVALUATION      HISTORY OF PRESENT ILLNESS            This is a 41 y.o. male who presents in consultation to cardiology clinic for evaluation of New Patient (New Pt)    He has a past medical history of reflux.  Otherwise no significant cardiac history other than a remote history of SVT that he first experienced roughly 20 years ago.  Saw cardiologist back then who recommended just continued monitoring.  He has various triggers for it such as dehydration or working out in the heat or any other stress.  Generally speaking may have been manageable until an episode recently while playing pickle ball where his heart rate went up to 210.  The episode lasted all told around 5 to 6 hours.  Interestingly after couple of hours when he made at home it seems to have ramped down to 170 then 150 then 130 then back to normal.  Felt lightheaded during the episode but otherwise no chest pain or shortness of breath.  Otherwise he has a very active person exercising regularly.  No significant family history.      PAST MEDICAL, SOCIAL AND FAMILY HISTORY          Past Medical History:   has a past medical history of Allergic rhinitis, GERD (gastroesophageal reflux disease), Seasonal allergies, and SVT (supraventricular tachycardia).    Past Surgical History:   has a past surgical history that includes Tonsillectomy and adenoidectomy; hernia repair; and Wisdom tooth extraction.     Social History:   reports that he quit smoking about 10 years ago. His smoking use included cigarettes. He has a 15.00 pack-year smoking history. He has never used smokeless tobacco. He reports current alcohol use. He reports that he does not use drugs.     Family History: family history includes  Cancer in his maternal grandmother; Diabetes in his father.      MEDICATIONS AND ALLERGIES           Prior to Admission medications    Medication Sig Start Date End Date Taking? Authorizing Provider   propranolol (INDERAL) 10 MG tablet Take 1 tablet by mouth 3 times daily as needed (palpitations) 12/07/21  Yes Denzil Magnuson, MD   omeprazole (PRILOSEC) 40 MG delayed release capsule Take 1 capsule by mouth daily 01/22/21  Yes Crickman, Holli Humbles, MD   fluticasone [facility]) 50 MCG/ACT nasal spray 1 spray in each nostril Nasally Once a day for 30 day(s)   Yes Rsfh Automatic Reconciliation, Rsfh, MD   hyoscyamine (LEVSIN/SL) 125 MCG sublingual tablet  11/21/20   San Jetty, MD   LINZESS 290 MCG CAPS capsule 1 tablet once a day  Patient not taking: Reported on 12/07/2021 12/28/20   [provider]       Allergies:  Sulfa antibiotics        REVIEW OF SYSTEMS       Review of Systems   Constitutional:  Positive for unexpected weight change. Negative for chills, fatigue and fever.   HENT:  Negative for hearing loss, nosebleeds, sore throat, tinnitus and voice change.    Eyes:  Negative for visual disturbance.   Respiratory:  Negative for apnea, cough, chest tightness, shortness of breath and wheezing.    Cardiovascular:  Negative for chest pain, palpitations and leg swelling.  Gastrointestinal:  Negative for anal bleeding, blood in stool, constipation and diarrhea.   Endocrine: Negative for polydipsia.   Genitourinary:  Negative for difficulty urinating, dysuria, hematuria and urgency.   Musculoskeletal:  Negative for myalgias.   Skin:  Negative for rash and wound.   Neurological:  Negative for dizziness, syncope, weakness, light-headedness and numbness.   Hematological:  Does not bruise/bleed easily.   Psychiatric/Behavioral:  Negative for sleep disturbance.           PHYSICAL EXAM          BP 120/84 (Site: Left Upper Arm, Position: Sitting, Cuff Size: Large Adult)   Pulse 66   Resp 17   Ht 5\' 10"   (1.778 m)   Wt 238 lb (108 kg)   SpO2 96%   BMI 34.15 kg/m      GEN: NAD  HEENT: NCAT  NECK: No JVD, No carotid bruits, normal carotid upstrokes  CV: RRR, no murmurs, normal S1, S2  Lungs: CTAB  Abdomen: soft, NT, ND  Extremities: No edema  Pulses: 2+  Neuro: non-focal  Psych: appropriate      CARDIAC DIAGNOSTICS          Last EKG: No results found for this or any previous visit (from the past 4464 hour(s)).    Last Echo: No results found for this or any previous visit.      Previous Cardiac Cath: No results found for this or any previous visit.       Last Stress Test: No results found for this or any previous visit.         ASSESSMENT AND RECOMMENDATIONS          1. Supraventricular tachycardia  -     EKG 12 Lead - Clinic Performed  -     Cardiac event monitor; Future  -     Echo (TTE) complete (PRN contrast/bubble/strain/3D); Future     His exam is benign.  The history is consistent with symptomatic SVT, difficult to say whether or not this is an ectopic atrial tachycardia or an AVNRT.  We will have him wear a 30-day monitor and obtain a surface echocardiogram.  We will have a low threshold to refer him to EP depending on what we find.  If there is AVNRT present he is very interested in having an ablation.      , MD   [facility] / [facility]

## 2021-12-14 ENCOUNTER — Ambulatory Visit: Admit: 2021-12-14 | Discharge: 2022-01-25 | Payer: PRIVATE HEALTH INSURANCE | Primary: Family Medicine

## 2021-12-14 DIAGNOSIS — I471 Supraventricular tachycardia, unspecified: Secondary | ICD-10-CM

## 2022-01-06 ENCOUNTER — Encounter

## 2022-01-06 MED ORDER — OMEPRAZOLE 40 MG PO CPDR
40 MG | ORAL_CAPSULE | Freq: Every day | ORAL | 1 refills | Status: AC
Start: 2022-01-06 — End: 2022-07-29

## 2022-01-14 ENCOUNTER — Encounter: Payer: PRIVATE HEALTH INSURANCE | Attending: Family Medicine | Primary: Family Medicine

## 2022-01-14 ENCOUNTER — Encounter
Admit: 2022-01-14 | Discharge: 2022-01-14 | Payer: PRIVATE HEALTH INSURANCE | Attending: Family Medicine | Primary: Family Medicine

## 2022-01-14 DIAGNOSIS — Z0000 Encounter for general adult medical examination without abnormal findings: Secondary | ICD-10-CM

## 2022-01-14 MED ORDER — CLOTRIMAZOLE-BETAMETHASONE 1-0.05 % EX CREA
CUTANEOUS | 1 refills | Status: AC
Start: 2022-01-14 — End: 2022-01-24

## 2022-01-14 NOTE — Patient Instructions (Signed)
Starting a Weight Loss Plan: Care Instructions  Overview     If you're thinking about losing weight, it can be hard to know where to start. Your doctor can help you set up a weight loss plan that best meets your needs. You may want to take a class on nutrition or exercise, or you could join a weight loss support group. If you have questions about how to make changes to your eating or exercise habits, ask your doctor about seeing a registered dietitian or an exercise specialist.  It can be a big challenge to lose weight. But you don't have to make huge changes at once. Make small changes, and stick with them. When those changes become habit, add a few more changes.  If you don't think you're ready to make changes right now, try to pick a date in the future. Make an appointment to see your doctor to discuss whether the time is right for you to start a plan.  Follow-up care is a key part of your treatment and safety. Be sure to make and go to all appointments, and call your doctor if you are having problems. It's also a good idea to know your test results and keep a list of the medicines you take.  How can you care for yourself at home?  Set realistic goals. Many people expect to lose much more weight than is likely. A weight loss of 5% to 10% of your body weight may be enough to improve your health.  Get family and friends involved to provide support. Talk to them about why you are trying to lose weight, and ask them to help. They can help by participating in exercise and having meals with you, even if they may be eating something different.  Find what works best for you. If you do not have time or do not like to cook, a program that offers meal replacement bars or shakes may be better for you. Or if you like to prepare meals, finding a plan that includes daily menus and recipes may be best.  Ask your doctor about other health professionals who can help you achieve your weight loss goals.  A dietitian can help  you make healthy changes in your diet.  An exercise specialist or personal trainer can help you develop a safe and effective exercise program.  A counselor or psychiatrist can help you cope with issues such as depression, anxiety, or family problems that can make it hard to focus on weight loss.  Consider joining a support group for people who are trying to lose weight. Your doctor can suggest groups in your area.  Where can you learn more?  Go to https://www.healthwise.net/patientEd and enter U357 to learn more about "Starting a Weight Loss Plan: Care Instructions."  Current as of: February 28, 2023Content Version: 13.8   2006-2023 Healthwise, Inc[employer].   Care instructions adapted under license by [facility]. If you have questions about a medical condition or this instruction, always ask your healthcare professional. Healthwise, Incorporated disclaims any warranty or liability for your use of this information.           Well Visit, Ages 18 to 65: Care Instructions  Well visits can help you stay healthy. Your doctor has checked your overall health and may have suggested ways to take good care of yourself. Your doctor also may have recommended tests. You can help prevent illness with healthy eating, good sleep, vaccinations, regular exercise, and other steps.      Get the tests that you and your doctor decide on. Depending on your age and risks, examples might include screening for diabetes; hepatitis C; HIV; and cervical, breast, lung, and colon cancer. Screening helps find diseases before any symptoms appear.   Eat healthy foods. Choose fruits, vegetables, whole grains, lean protein, and low-fat dairy foods. Limit saturated fat and reduce salt.     Limit alcohol. Men should have no more than 2 drinks a day. Women should have no more than 1. For some people, no alcohol is the best choice.   Exercise. Get at least 30 minutes of exercise on most days of the week. Walking can be a good choice.      Reach and stay at your healthy weight. This will lower your risk for many health problems.   Take care of your mental health. Try to stay connected with friends, family, and community, and find ways to manage stress.     If you're feeling depressed or hopeless, talk to someone. A counselor can help. If you don't have a counselor, talk to your doctor.   Talk to your doctor if you think you may have a problem with alcohol or drug use. This includes prescription medicines and illegal drugs.     Avoid tobacco and nicotine: Don't smoke, vape, or chew. If you need help quitting, talk to your doctor.   Practice safer sex. Getting tested, using condoms or dental dams, and limiting sex partners can help prevent STIs.     Use birth control if it's important to you to prevent pregnancy. Talk with your doctor about your choices and what might be best for you.   Prevent problems where you can. Protect your skin from too much sun, wash your hands, brush your teeth twice a day, and wear a seat belt in the car.   Where can you learn more?  Go to https://www.healthwise.net/patientEd and enter P072 to learn more about "Well Visit, Ages 18 to 65: Care Instructions."  Current as of: November 13, 2022Content Version: 13.8   2006-2023 Healthwise, Inc[employer].   Care instructions adapted under license by [facility]. If you have questions about a medical condition or this instruction, always ask your healthcare professional. Healthwise, Incorporated disclaims any warranty or liability for your use of this information.

## 2022-01-14 NOTE — Progress Notes (Signed)
Well Adult Note  Name: [name] Today's Date: 01/14/2022   MRN: [id number] Sex: Male   Age: 41 y.o. Ethnicity: Non-Hispanic / Non Latino   DOB: Nov 12, 1980 Race: White (non-Hispanic)      Ranae Plumber is here for well adult exam.  History:  Chief Complaint   Patient presents with    Annual Exam     Patient here for wellness exam.     States he has been making effort to eat healthy and get routine exercise.         Review of Systems    ROS as per HPI or otherwise negative.       Allergies   Allergen Reactions    Sulfa Antibiotics Rash and Hives         Prior to Visit Medications    Medication Sig Taking? Authorizing Provider   clotrimazole-betamethasone (LOTRISONE) 1-0.05 % cream Apply topically 2 times daily. Yes Idalia Allbritton, Brand Males, MD   omeprazole (PRILOSEC) 40 MG delayed release capsule TAKE ONE CAPSULE BY MOUTH ONE TIME DAILY Yes Jenai Scaletta, Brand Males, MD   propranolol (INDERAL) 10 MG tablet Take 1 tablet by mouth 3 times daily as needed (palpitations) Yes Arnette Felts, MD   hyoscyamine (LEVSIN/SL) 125 MCG sublingual tablet  Yes [place], Florentina Addison, MD   LINZESS 290 MCG CAPS capsule 1 tablet once a day Yes [provider]   fluticasone (FLONASE) 50 MCG/ACT nasal spray 1 spray in each nostril Nasally Once a day for 30 day(s) Yes Rsfh Automatic Reconciliation, Rsfh, MD         Past Medical History:   Diagnosis Date    Allergic rhinitis     GERD (gastroesophageal reflux disease)     Seasonal allergies     SVT (supraventricular tachycardia)        Past Surgical History:   Procedure Laterality Date    HERNIA REPAIR      TONSILLECTOMY AND ADENOIDECTOMY      WISDOM TOOTH EXTRACTION           Family History   Problem Relation Age of Onset    Diabetes Father     Cancer Maternal Grandmother        Social History     Tobacco Use    Smoking status: Former     Packs/day: 1.00     Years: 15.00     Additional pack years: 0.00     Total pack years: 15.00     Types: Cigarettes     Quit date: 2013     Years since  quitting: 10.8    Smokeless tobacco: Never   Vaping Use    Vaping Use: Never used   Substance Use Topics    Alcohol use: Yes    Drug use: Never       Objective     Vital Signs  BP 124/78   Pulse 83   Ht 1.778 m (5\' 10" )   Wt 109.8 kg (242 lb)   SpO2 97%   BMI 34.72 kg/m   Wt Readings from Last 3 Encounters:   01/14/22 109.8 kg (242 lb)   12/07/21 108 kg (238 lb)   01/09/21 109.2 kg (240 lb 12.8 oz)       Waist Circumference  There were no vitals filed for this visit.    Physical Exam       GENERAL: The patient is in no apparent distress. Alert and oriented. Vital Signs Reviewed HEENT: Head is normocephalic and atraumatic.  Extraocular muscles are intact. Pupils are equal, Conjunctiva normal. Moist Mucous membranes. Posterior pharynx clear of any exudate or lesions. NECK: Supple. No Lymphadenopathy LUNGS: Clear to auscultation bilaterally. Breath Sounds equal. HEART: Regular rate and rhythm without murmur. No edema ABDOMEN: Soft, nontender, and nondistended. . Musculoskeletal: No deformity. Gait WNL. No swelling noted. NEUROLOGIC: Alert and Oriented. No focal deficits. PSYCHIATRIC: Cooperative, mood appropriate. SKIN: No rash noted. Left lower medial leg with erythematous papules in round shape with central clearing.       Assessment   Plan   1. Encounter for well adult exam without abnormal findings  -     Lipid Panel; Future  -     TSH with Reflex (CERNER); Future  -     External Referral To Dermatology  2. Tinea corporis       Patient doing well. Blood pressure  in good range.. Check routine labs. Reviewed recent CBC anc CMP. Continue current medications. Encouraged healthy eating and physical activity efforts.     Will refer to derm for routine skin check.     Will treat tinea with clotrimazole/betamethasone for 10 to 14 days. If does not resolve, he will see derm as referred.       Return in about 1 year (around 01/15/2023) for CPE.

## 2022-01-29 ENCOUNTER — Inpatient Hospital Stay
Admit: 2022-01-29 | Discharge: 2022-02-28 | Payer: PRIVATE HEALTH INSURANCE | Attending: Internal Medicine | Primary: Family Medicine

## 2022-01-29 DIAGNOSIS — I471 Supraventricular tachycardia, unspecified: Secondary | ICD-10-CM

## 2022-02-10 DIAGNOSIS — I471 Supraventricular tachycardia, unspecified: Secondary | ICD-10-CM

## 2022-02-11 LAB — ECHO (TTE) COMPLETE (PRN CONTRAST/BUBBLE/STRAIN/3D)
AV Area by Peak Velocity: 2.5 cm2
AV Area by VTI: 2.8 cm2
AV Cusp Mmode: 2.1 cm
AV Mean Gradient: 6 mmHg
AV Mean Velocity: 1.1 m/s
AV Peak Gradient: 11 mmHg
AV Peak Velocity: 1.7 m/s
AV VTI: 32.5 cm
AV Velocity Ratio: 0.65
AVA/BSA Peak Velocity: 1.1 cm2/m2
AVA/BSA VTI: 1.2 cm2/m2
Ao Root Index: 1.42 cm/m2
Aortic Root: 3.2 cm
Aortic Sinus Valsalva Index: 1.59 cm/m2
Aortic Sinus Valsalva: 3.6 cm
Ascending Aorta Index: 1.5 cm/m2
Ascending Aorta: 3.4 cm
Body Surface Area: 2.33 m2
E/E' Lateral: 5.35
E/E' Ratio (Averaged): 7.13
EF BP: 58 % (ref 55–100)
Est. RA Pressure: 3 mmHg
Global Longitudinal Strain: -18.3 %
IVC Expiration: 1.7 cm
IVSd: 0.8 cm (ref 0.6–1.0)
LA Area 2C: 21.1 cm2
LA Area 4C: 22.3 cm2
LA Diameter: 4 cm
LA Major Axis: 6.2 cm
LA Minor Axis: 6.1 cm
LA Size Index: 1.77 cm/m2
LA Volume BP: 62 mL — AB (ref 18–58)
LA Volume Index BP: 27 ml/m2 (ref 16–34)
LA Volume Index MOD A2C: 26 ml/m2 (ref 16–34)
LA Volume Index MOD A4C: 29 ml/m2 (ref 16–34)
LA Volume MOD A2C: 59 mL — AB (ref 18–58)
LA Volume MOD A4C: 65 mL — AB (ref 18–58)
LA/AO Root Ratio: 1.25
LV E' Lateral Velocity: 20 cm/s
LV E' Septal Velocity: 12 cm/s
LV EDV A2C: 105 mL
LV EDV A4C: 130 mL
LV EDV Index A2C: 46 mL/m2
LV EDV Index A4C: 58 mL/m2
LV ESV A2C: 47 mL
LV ESV A4C: 55 mL
LV ESV Index A2C: 21 mL/m2
LV ESV Index A4C: 24 mL/m2
LV Ejection Fraction A2C: 55 %
LV Ejection Fraction A4C: 58 %
LV Mass 2D Index: 66.4 g/m2 (ref 49–115)
LV Mass 2D: 150.1 g (ref 88–224)
LV RWT Ratio: 0.3
LVIDd Index: 2.35 cm/m2
LVIDd: 5.3 cm (ref 4.2–5.9)
LVOT Area: 3.8 cm2
LVOT Diameter: 2.2 cm
LVOT Mean Gradient: 3 mmHg
LVOT Peak Gradient: 5 mmHg
LVOT Peak Velocity: 1.1 m/s
LVOT SV: 92.3 ml
LVOT Stroke Volume Index: 40.9 mL/m2
LVOT VTI: 24.3 cm
LVOT:AV VTI Index: 0.75
LVPWd: 0.8 cm (ref 0.6–1.0)
MV A Velocity: 0.7 m/s
MV E Velocity: 1.07 m/s
MV E Wave Deceleration Time: 209 ms
MV E/A: 1.53
RA Area 4C: 20.1 cm2
RV Free Wall Peak S': 15 cm/s
RVIDd: 3.6 cm
RVSP: 19 mmHg
Sinotubular Junction: 2.9 cm
TAPSE: 2 cm (ref 1.7–?)
TR Max Velocity: 2 m/s
TR Peak Gradient: 16 mmHg

## 2022-03-13 NOTE — Telephone Encounter (Signed)
lm on pt vml reminding of appt 1/11 1:45 w/ [facility] location. please confirm

## 2022-03-14 ENCOUNTER — Encounter: Payer: PRIVATE HEALTH INSURANCE | Attending: Internal Medicine | Primary: Family Medicine

## 2022-06-01 ENCOUNTER — Emergency Department: Admit: 2022-06-02 | Payer: PRIVATE HEALTH INSURANCE | Primary: Family Medicine

## 2022-06-01 ENCOUNTER — Inpatient Hospital Stay
Admit: 2022-06-01 | Discharge: 2022-06-02 | Disposition: A | Discharge status: Home / Self Care | Payer: PRIVATE HEALTH INSURANCE

## 2022-06-01 DIAGNOSIS — R109 Unspecified abdominal pain: Secondary | ICD-10-CM

## 2022-06-01 DIAGNOSIS — R1011 Right upper quadrant pain: Secondary | ICD-10-CM

## 2022-06-01 LAB — URINALYSIS W/ RFLX MICROSCOPIC
Bilirubin Urine: NEGATIVE
Blood, Urine: NEGATIVE
Glucose, UA: NEGATIVE
Ketones, Urine: NEGATIVE
Leukocyte Esterase, Urine: NEGATIVE
Nitrite, Urine: NEGATIVE
Protein, UA: NEGATIVE
Specific Gravity, UA: 1.025 (ref 1.003–1.035)
Urobilinogen, Urine: 0.2 EU/dL (ref >1)
pH, UA: 6 (ref 4.5–8.0)

## 2022-06-01 MED ORDER — SODIUM CHLORIDE 0.9 % IV BOLUS
0.9 | Freq: Once | INTRAVENOUS | Status: AC
Start: 2022-06-01 — End: 2022-06-01
  Administered 2022-06-02: 1000 mL via INTRAVENOUS

## 2022-06-01 NOTE — ED Provider Notes (Cosign Needed)
[facility] EMERGENCY DEPT  EMERGENCY DEPARTMENT ENCOUNTER      Pt Name: [name]  MRN: [id number]  [place] [birth year]  Date of evaluation: 06/01/2022  Provider evaluation time: 06/01/22 1933  Provider: Lannette Donath, PA-C    CHIEF COMPLAINT       Chief Complaint   Patient presents with    Abdominal Pain     Abdominal pain and bilateral flank pain x 1 week. History of diverticulitis. Endorses diarrhea. Denies gu symptoms.     Flank Pain         HISTORY OF PRESENT ILLNESS    Patient presents for evaluation for 42 year old generalized abdominal pain that started on the right flank radiates to the mid epigastric area.  Pain that radiates to the left lower quadrant as well.  Patient fever, vomiting.  Patient had mild nausea.  Patient states he has a history of recurrent diverticulitis patient has no history of kidney related issues or kidney stones.  Patient otherwise is stable at this time.    The history is provided by the patient.       Nursing Notes were reviewed.    REVIEW OF SYSTEMS       Review of Systems   Constitutional:  Negative for chills and fever.   HENT:  Negative for ear pain and sore throat.    Eyes:  Negative for pain and visual disturbance.   Respiratory:  Negative for apnea, chest tightness and shortness of breath.    Cardiovascular:  Negative for chest pain and palpitations.   Gastrointestinal:  Positive for abdominal pain and nausea. Negative for vomiting.   Genitourinary:  Negative for dysuria and urgency.   Musculoskeletal:  Negative for arthralgias and myalgias.   Skin:  Negative for color change, rash and wound.   Neurological:  Negative for speech difficulty, weakness, numbness and headaches.   Psychiatric/Behavioral:  Negative for behavioral problems and confusion.    All other systems reviewed and are negative.      Except as noted above the remainder of the review of systems was reviewed and negative.       PAST MEDICAL HISTORY     Past Medical History:   Diagnosis Date    Allergic  rhinitis     GERD (gastroesophageal reflux disease)     Seasonal allergies     SVT (supraventricular tachycardia)        SURGICAL HISTORY       Past Surgical History:   Procedure Laterality Date    HERNIA REPAIR      TONSILLECTOMY AND ADENOIDECTOMY      WISDOM TOOTH EXTRACTION         CURRENT MEDICATIONS       Previous Medications    FLUTICASONE (FLONASE) 50 MCG/ACT NASAL SPRAY    1 spray in each nostril Nasally Once a day for 30 day(s)    HYOSCYAMINE (LEVSIN/SL) 125 MCG SUBLINGUAL TABLET        LINZESS 290 MCG CAPS CAPSULE    1 tablet once a day    OMEPRAZOLE (PRILOSEC) 40 MG DELAYED RELEASE CAPSULE    TAKE ONE CAPSULE BY MOUTH ONE TIME DAILY    PROPRANOLOL (INDERAL) 10 MG TABLET    Take 1 tablet by mouth 3 times daily as needed (palpitations)       ALLERGIES     Sulfa antibiotics    FAMILY HISTORY       Family History   Problem Relation Age of Onset    Diabetes  Father     Cancer Maternal Grandmother         SOCIAL HISTORY       Social History     Socioeconomic History    Marital status: Married     Spouse name: None    Number of children: None    Years of education: None    Highest education level: None   Tobacco Use    Smoking status: Former     Current packs/day: 0.00     Average packs/day: 1 pack/day for 15.0 years (15.0 ttl pk-yrs)     Types: Cigarettes     Start date: 65     Quit date: 2013     Years since quitting: 11.2    Smokeless tobacco: Never   Vaping Use    Vaping Use: Never used   Substance and Sexual Activity    Alcohol use: Yes    Drug use: Never       SCREENINGS         Glasgow Coma Scale  Eye Opening: Spontaneous  Best Verbal Response: Oriented  Best Motor Response: Obeys commands  Glasgow Coma Scale Score: 15                     CIWA Assessment  BP: (!) 141/84  Pulse: 88                 PHYSICAL EXAM    (up to 7 for level 4, 8 or more for level 5)     ED Triage Vitals [06/01/22 1911]   BP Temp Temp Source Pulse Respirations SpO2 Height Weight - Scale   (!) 154/102 98.8 F (37.1 C) Oral 98 18  94 % 1.778 m (5\' 10" ) 108 kg (238 lb)       Physical Exam  Vitals and nursing note reviewed.   Constitutional:       Appearance: Normal appearance.   HENT:      Head: Normocephalic.      Right Ear: Tympanic membrane normal.      Left Ear: Tympanic membrane normal.      Nose: Nose normal.      Mouth/Throat:      Mouth: Mucous membranes are moist.      Pharynx: Oropharynx is clear.   Eyes:      Extraocular Movements: Extraocular movements intact.      Pupils: Pupils are equal, round, and reactive to light.   Cardiovascular:      Rate and Rhythm: Normal rate and regular rhythm.      Pulses: Normal pulses.      Heart sounds: Normal heart sounds.   Pulmonary:      Effort: Pulmonary effort is normal.      Breath sounds: Normal breath sounds.   Abdominal:      General: Abdomen is flat. Bowel sounds are normal.      Palpations: Abdomen is soft.          Comments: Patient has mild abdominal pain over the upper abdominal area with increased pain in the RUQ. Patient also has pain in the left lower quadrant. No distention.  No guarding.  Normal bowel sounds.  Abdomin is otherwise Soft.  No CVA tenderness.   Musculoskeletal:         General: No tenderness or signs of injury. Normal range of motion.      Cervical back: Normal range of motion and neck supple.   Skin:     General:  Skin is warm and dry.   Neurological:      General: No focal deficit present.      Mental Status: He is alert and oriented to person, place, and time.   Psychiatric:         Mood and Affect: Mood normal.         Behavior: Behavior normal.         DIAGNOSTIC RESULTS       PROCEDURES:  Unless otherwise noted below, none     Procedures    EKG: All EKG's are interpreted by the Emergency Department Physician who either signs or Co-signs this chart in the absence of a cardiologist.    LABS:  Labs Reviewed   URINALYSIS W/ RFLX MICROSCOPIC   CBC WITH AUTO DIFFERENTIAL   COMPREHENSIVE METABOLIC PANEL   LIPASE   MAGNESIUM       All other labs were within normal  range or not returned as of this dictation.    RADIOLOGY:   Non-plain film images such as CT, Ultrasound and MRI are read by the radiologist. Plain radiographic images are visualized and preliminarily interpreted by the emergency physician with the below findings:    Interpretation per the Radiologist below, if available at the time of this note:    CT ABDOMEN PELVIS W IV CONTRAST Additional Contrast? None   Final Result      No acute findings identified in the abdomen or pelvis.            EMERGENCY DEPARTMENT COURSE/REASSESSMENT and MDM:   Vitals:    Vitals:    06/01/22 1911 06/01/22 1948   BP: (!) 154/102 (!) 141/84   Pulse: 98 88   Resp: 18    Temp: 98.8 F (37.1 C)    TempSrc: Oral    SpO2: 94%    Weight: 108 kg (238 lb)    Height: 1.778 m (5\' 10" )        Medical Decision Making  I had a lengthy discussion with the patient regarding all test results.  Patient is to follow-up with his primary care physician or GI specialist for reevaluation of current symptoms and continued management.  Patient was given strict return precautions if symptoms worsen or new symptoms develop.     Amount and/or Complexity of Data Reviewed  External Data Reviewed: notes.  Labs: ordered. Decision-making details documented in ED Course.  Radiology: ordered. Decision-making details documented in ED Course.    Risk  Prescription drug management.        ED Course:    ED Course as of 06/01/22 2119   Sat Jun 01, 2022   2119 I had a lengthy discussion with the patient regarding all test results.  [PF]      ED Course User Index  [PF] Lannette Donath, PA-C         FINAL IMPRESSION      1. Abdominal pain, unspecified abdominal location          DISPOSITION/PLAN   DISPOSITION Decision To Discharge 06/01/2022 09:16:18 PM      PATIENT REFERRED TO:  Earlene Plater, MD  [address]  [address]  [place] [address]  [phone]    In 1 week      Michail Sermon, MD  [address]  [place] [address]  [phone]    In 1  week        DISCHARGE MEDICATIONS:  New Prescriptions    DICYCLOMINE (BENTYL)  20 MG TABLET    Take 1 tablet by mouth every 6 hours for 4 days As needed for abdominal pain    NAPROXEN SODIUM (ANAPROX DS) 550 MG TABLET    Take 1 tablet by mouth 2 times daily (with meals) for 7 days     Controlled Substances Monitoring:          No data to display                (Please note that portions of this note were completed with a voice recognition program.  Efforts were made to edit the dictations but occasionally words are mis-transcribed.)    Lannette Donath, PA-C (electronically signed)  Attending Emergency Physician           Lannette Donath, PA-C  06/01/22 2120

## 2022-06-02 LAB — MAGNESIUM: Magnesium: 2 mg/dL (ref 1.6–2.6)

## 2022-06-02 LAB — COMPREHENSIVE METABOLIC PANEL
ALT: 39 U/L (ref 0–50)
AST: 26 U/L (ref 0–50)
Albumin/Globulin Ratio: 2 (ref 1.00–2.70)
Albumin: 4.4 g/dL (ref 3.5–5.2)
Alk Phosphatase: 87 U/L (ref 40–130)
Anion Gap: 12 mmol/L (ref 2–17)
BUN: 16 mg/dL (ref 6–20)
CO2: 25 mmol/L (ref 22–29)
Calcium: 9.8 mg/dL (ref 8.5–10.7)
Chloride: 103 mmol/L (ref 98–107)
Creatinine: 1.1 mg/dL (ref 0.7–1.3)
Est, Glom Filt Rate: 86 mL/min/1.73m (ref >=60)
Globulin: 2.8 g/dL (ref 1.9–4.4)
Glucose: 94 mg/dL (ref 70–99)
OSMOLALITY CALCULATED: 280 mOsm/kg (ref 270–287)
Potassium: 3.8 mmol/L (ref 3.5–5.3)
Sodium: 140 mmol/L (ref 135–145)
Total Bilirubin: 0.37 mg/dL (ref 0.00–1.20)
Total Protein: 7.2 g/dL (ref 5.7–8.3)

## 2022-06-02 LAB — CBC WITH AUTO DIFFERENTIAL
Absolute Baso #: 0.1 10*3/uL (ref 0.0–0.2)
Absolute Eos #: 0.1 10*3/uL (ref 0.0–0.5)
Absolute Lymph #: 3 10*3/uL (ref 1.0–3.2)
Absolute Mono #: 0.9 10*3/uL (ref 0.3–1.0)
Basophils %: 0.6 % (ref 0.0–2.0)
Eosinophils %: 1.1 % (ref 0.0–7.0)
Hematocrit: 44.6 % (ref 38.0–52.0)
Hemoglobin: 15.3 g/dL (ref 13.0–17.3)
Immature Grans (Abs): 0.01 10*3/uL (ref 0.00–0.06)
Immature Granulocytes: 0.1 % (ref 0.0–0.6)
Lymphocytes: 30.6 % (ref 15.0–45.0)
MCH: 31.3 pg (ref 27.0–34.5)
MCHC: 34.3 g/dL (ref 32.0–36.0)
MCV: 91.2 fL (ref 84.0–100.0)
MPV: 10.9 fL (ref 7.2–13.2)
Monocytes: 9.2 % (ref 4.0–12.0)
Neutrophils %: 58.4 % (ref 42.0–74.0)
Neutrophils Absolute: 5.7 10*3/uL (ref 1.6–7.3)
Platelets: 184 10*3/uL (ref 140–440)
RBC: 4.89 x10e6/mcL (ref 4.00–5.60)
RDW: 12.3 % (ref 11.0–16.0)
WBC: 9.7 10*3/uL (ref 3.8–10.6)

## 2022-06-02 LAB — LIPASE: Lipase: 52 U/L (ref 13–60)

## 2022-06-02 MED ORDER — DICYCLOMINE HCL 20 MG PO TABS
20 MG | ORAL_TABLET | Freq: Four times a day (QID) | ORAL | 0 refills | Status: DC
Start: 2022-06-02 — End: 2022-12-16

## 2022-06-02 MED ORDER — KETOROLAC TROMETHAMINE 15 MG/ML IJ SOLN
15 | INTRAMUSCULAR | Status: AC
Start: 2022-06-02 — End: 2022-06-01
  Administered 2022-06-02: 15 via INTRAVENOUS

## 2022-06-02 MED ORDER — IOPAMIDOL 61 % IV SOLN
61 | Freq: Once | INTRAVENOUS | Status: AC | PRN
Start: 2022-06-02 — End: 2022-06-01
  Administered 2022-06-02: 100 mL via INTRAVENOUS

## 2022-06-02 MED ORDER — KETOROLAC TROMETHAMINE 15 MG/ML IJ SOLN
15 | Freq: Once | INTRAMUSCULAR | Status: AC
Start: 2022-06-02 — End: 2022-06-01

## 2022-06-02 MED ORDER — NAPROXEN SODIUM 550 MG PO TABS
550 MG | ORAL_TABLET | Freq: Two times a day (BID) | ORAL | 0 refills | Status: DC
Start: 2022-06-02 — End: 2022-12-16

## 2022-06-02 MED FILL — KETOROLAC TROMETHAMINE 15 MG/ML IJ SOLN: 15 MG/ML | INTRAMUSCULAR | Qty: 1

## 2022-07-26 ENCOUNTER — Encounter

## 2022-07-29 MED ORDER — OMEPRAZOLE 40 MG PO CPDR
40 MG | ORAL_CAPSULE | Freq: Every day | ORAL | 1 refills | Status: DC
Start: 2022-07-29 — End: 2022-12-16

## 2022-12-16 ENCOUNTER — Ambulatory Visit
Admit: 2022-12-16 | Discharge: 2022-12-16 | Payer: PRIVATE HEALTH INSURANCE | Attending: Family Medicine | Primary: Family Medicine

## 2022-12-16 DIAGNOSIS — Z0000 Encounter for general adult medical examination without abnormal findings: Secondary | ICD-10-CM

## 2022-12-16 LAB — COMPREHENSIVE METABOLIC PANEL
ALT: 31 U/L (ref 0–50)
AST: 20 U/L (ref 0–50)
Albumin/Globulin Ratio: 2.3 (ref 1.00–2.70)
Albumin: 4.9 g/dL (ref 3.5–5.2)
Alk Phosphatase: 82 U/L (ref 40–130)
Anion Gap: 14 mmol/L (ref 2–17)
BUN: 19 mg/dL (ref 6–20)
CO2: 24 mmol/L (ref 22–29)
Calcium: 9.3 mg/dL (ref 8.5–10.7)
Chloride: 102 mmol/L (ref 98–107)
Creatinine: 1 mg/dL (ref 0.7–1.3)
Est, Glom Filt Rate: 96 mL/min/1.73mÂ² (ref >=60)
Globulin: 2.1 g/dL (ref 1.9–4.4)
Glucose: 103 mg/dL — ABNORMAL HIGH (ref 70–99)
Osmolaliy Calculated: 282 mosm/kg (ref 270–287)
Potassium: 4.6 mmol/L (ref 3.5–5.3)
Sodium: 140 mmol/L (ref 135–145)
Total Bilirubin: 0.43 mg/dL (ref 0.00–1.20)
Total Protein: 7 g/dL (ref 5.7–8.3)

## 2022-12-16 LAB — CBC WITH AUTO DIFFERENTIAL
Basophils %: 0.7 % (ref 0.0–2.0)
Basophils Absolute: 0 10*3/uL (ref 0.0–0.2)
Eosinophils %: 2.1 % (ref 0.0–7.0)
Eosinophils Absolute: 0.1 10*3/uL (ref 0.0–0.5)
Hematocrit: 44 % (ref 38.0–52.0)
Hemoglobin: 15.4 g/dL (ref 13.0–17.3)
Immature Grans (Abs): 0.01 10*3/uL (ref 0.00–0.06)
Immature Granulocytes %: 0.2 % (ref 0.0–0.6)
Lymphocytes Absolute: 1.6 10*3/uL (ref 1.0–3.2)
Lymphocytes: 26.9 % (ref 15.0–45.0)
MCH: 31.7 pg (ref 27.0–34.5)
MCHC: 35 g/dL (ref 30.0–36.0)
MCV: 90.5 fL (ref 84.0–100.0)
MPV: 11.9 fL (ref 7.0–12.2)
Monocytes %: 9.1 % (ref 4.0–12.0)
Monocytes Absolute: 0.6 10*3/uL (ref 0.3–1.0)
NRBC Absolute: 0 10*3/uL (ref 0.000–0.012)
NRBC Automated: 0 % (ref 0.0–0.2)
Neutrophils %: 61 % (ref 42.0–74.0)
Neutrophils Absolute: 3.7 10*3/uL (ref 1.6–7.3)
Platelets: 171 10*3/uL (ref 140–440)
RBC: 4.86 x10e6/mcL (ref 4.00–5.60)
RDW: 12.7 % (ref 10.0–17.0)
WBC: 6.1 10*3/uL (ref 3.8–10.6)

## 2022-12-16 LAB — TSH WITH REFLEX: TSH: 1.57 u[IU]/mL (ref 0.358–3.740)

## 2022-12-16 LAB — LIPID PANEL
Chol/HDL Ratio: 4.9 — ABNORMAL HIGH (ref 0.0–4.4)
Cholesterol, Total: 232 mg/dL — ABNORMAL HIGH (ref 100–200)
HDL: 47 mg/dL (ref >=40)
LDL Cholesterol: 132.4 mg/dL — ABNORMAL HIGH (ref 0.0–100.0)
LDL/HDL Ratio: 2.8
Triglycerides: 263 mg/dL — ABNORMAL HIGH (ref 0–149)
VLDL: 52.6 mg/dL — ABNORMAL HIGH (ref 5.0–40.0)

## 2022-12-16 MED ORDER — OMEPRAZOLE 40 MG PO CPDR
40 MG | ORAL_CAPSULE | Freq: Every day | ORAL | 1 refills | Status: DC
Start: 2022-12-16 — End: 2023-07-29

## 2022-12-16 NOTE — Progress Notes (Signed)
Well Adult Note  Name: Kimothy Leonhart Today's Date: 12/16/2022   MRN: [id number] Sex: Male   Age: 42 y.o. Ethnicity: Non-Hispanic / Non Latino   DOB: [birth year] Race: White (non-Hispanic)      [name] is here for a well adult exam.       Subjective   History:  Patient here for wellness visit.     Notes he has been eating well and exercising.     Notes he has history of palpitations. Notes he has seen cardiology. Planning to follow-up. Notes he is taking propranolol prn.     Review of Systems  ROS as per HPI or otherwise negative.     Allergies   Allergen Reactions    Sulfa Antibiotics Rash and Hives     Prior to Visit Medications    Medication Sig Taking? Authorizing Provider   omeprazole (PRILOSEC) 40 MG delayed release capsule Take 1 capsule by mouth daily Yes Takera Rayl, Brand Males, MD   propranolol (INDERAL) 10 MG tablet Take 1 tablet by mouth 3 times daily as needed (palpitations) Yes Arnette Felts, MD   fluticasone [facility]) 50 MCG/ACT nasal spray 1 spray in each nostril Nasally Once a day for 30 day(s) Yes [facility] [employer], [facility], MD     Past Medical History:   Diagnosis Date    Allergic rhinitis     GERD (gastroesophageal reflux disease)     Seasonal allergies     SVT (supraventricular tachycardia) (HCC)      Past Surgical History:   Procedure Laterality Date    HERNIA REPAIR      TONSILLECTOMY AND ADENOIDECTOMY      VASECTOMY Bilateral     2023.    WISDOM TOOTH EXTRACTION       Family History   Problem Relation Age of Onset    Diabetes Father     Cancer Maternal Grandmother      Social History     Tobacco Use    Smoking status: Former     Current packs/day: 0.00     Average packs/day: 1 pack/day for 15.0 years (15.0 ttl pk-yrs)     Types: Cigarettes     Start date: 8     Quit date: 2013     Years since quitting: 11.7    Smokeless tobacco: Never   Vaping Use    Vaping status: Never Used   Substance Use Topics    Alcohol use: Yes    Drug use: Never           Objective   Vital Signs  BP  130/78   Pulse 69   Ht 1.778 m (5\' 10" )   Wt 110.2 kg (243 lb)   SpO2 97%   BMI 34.87 kg/m     Waist 44 inches.     Wt Readings from Last 3 Encounters:   12/16/22 110.2 kg (243 lb)   06/01/22 108 kg (238 lb)   01/29/22 109.8 kg (242 lb)       Physical Exam        GENERAL: The patient is in no apparent distress. Alert and oriented. Vital Signs Reviewed HEENT: Head is normocephalic and atraumatic. Extraocular muscles are intact. Pupils are equal, Conjunctiva normal. Moist Mucous membranes. Posterior pharynx clear of any exudate or lesions. NECK: Supple. No Lymphadenopathy LUNGS: Clear to auscultation bilaterally. Breath Sounds equal. HEART: Regular rate and rhythm without murmur. No edema ABDOMEN: Soft, nontender, and nondistended. . Musculoskeletal: No deformity. Gait WNL. No swelling  noted. NEUROLOGIC: Alert and Oriented. No focal deficits. PSYCHIATRIC: Cooperative, mood appropriate. SKIN: No rash noted.        Assessment & Plan   Encounter for well adult exam without abnormal findings  -     CBC with Auto Differential; Future  -     Comprehensive Metabolic Panel; Future  -     Lipid Panel; Future  -     TSH with Reflex; Future  -     Routine Venipuncture (91478)      Patient doing well. Blood pressure  controlled. Check routine labs. Continue current medications. Encouraged healthy eating and physical activity efforts. Will request records from pharmacy regarding vaccination record.           Return in about 1 year (around 12/16/2023) for CPE.

## 2022-12-16 NOTE — Patient Instructions (Signed)
Well Visit, Ages 78 to 82: Care Instructions  Well visits can help you stay healthy. Your doctor has checked your overall health and may have suggested ways to take good care of yourself. Your doctor also may have recommended tests. You can help prevent illness with healthy eating, good sleep, vaccinations, regular exercise, and other steps.    Get the tests that you and your doctor decide on. Depending on your age and risks, examples might include screening for diabetes; hepatitis C; HIV; and cervical, breast, lung, and colon cancer. Screening helps find diseases before any symptoms appear.   Eat healthy foods. Choose fruits, vegetables, whole grains, lean protein, and low-fat dairy foods. Limit saturated fat and reduce salt.     Limit alcohol. Men should have no more than 2 drinks a day. Women should have no more than 1. For some people, no alcohol is the best choice.   Exercise. Get at least 30 minutes of exercise on most days of the week. Walking can be a good choice.     Reach and stay at your healthy weight. This will lower your risk for many health problems.   Take care of your mental health. Try to stay connected with friends, family, and community, and find ways to manage stress.     If you're feeling depressed or hopeless, talk to someone. A counselor can help. If you don't have a counselor, talk to your doctor.   Talk to your doctor if you think you may have a problem with alcohol or drug use. This includes prescription medicines, marijuana, and other drugs.     Avoid tobacco and nicotine: Don't smoke, vape, or chew. If you need help quitting, talk to your doctor.   Practice safer sex. Getting tested, using condoms or dental dams, and limiting sex partners can help prevent STIs.     Use birth control if it's important to you to prevent pregnancy. Talk with your doctor about your choices and what might be best for you.   Prevent problems where you can. Protect your skin from too much sun, wash your  hands, brush your teeth twice a day, and wear a seat belt in the car.   Where can you learn more?  Go to [employer] and enter P072 to learn more about "Well Visit, Ages 20 to 24: Care Instructions."  Current as of: October 07, 2021  Content Version: 14.2   [address], [place].   Care instructions adapted under license by [facility]. If you have questions about a medical condition or this instruction, always ask your healthcare professional. [employer] disclaims any warranty or liability for your use of this information.

## 2023-07-29 ENCOUNTER — Encounter

## 2023-07-29 MED ORDER — OMEPRAZOLE 40 MG PO CPDR
40 | ORAL_CAPSULE | Freq: Every day | ORAL | 1 refills | 90.00000 days | Status: DC
Start: 2023-07-29 — End: 2023-12-22

## 2023-09-26 ENCOUNTER — Emergency Department: Admit: 2023-09-26 | Payer: PRIVATE HEALTH INSURANCE | Primary: Family Medicine

## 2023-09-26 LAB — CBC WITH AUTO DIFFERENTIAL
Basophils %: 0.5 % (ref 0.0–2.0)
Basophils Absolute: 0 x10e3/mcL (ref 0.0–0.2)
Eosinophils %: 1 % (ref 0.0–7.0)
Eosinophils Absolute: 0.1 x10e3/mcL (ref 0.0–0.5)
Hematocrit: 41.4 % (ref 38.0–52.0)
Hemoglobin: 14.2 g/dL (ref 13.0–17.3)
Immature Grans (Abs): 0.01 x10e3/mcL (ref 0.00–0.06)
Immature Granulocytes %: 0.2 % (ref 0.0–0.6)
Lymphocytes Absolute: 1.6 x10e3/mcL (ref 1.0–3.2)
Lymphocytes: 25 % (ref 15.0–45.0)
MCH: 31.9 pg (ref 27.0–34.5)
MCHC: 34.3 g/dL (ref 30.0–36.0)
MCV: 93 fL (ref 84.0–100.0)
MPV: 10.8 fL (ref 7.0–12.2)
Monocytes %: 8.6 % (ref 4.0–12.0)
Monocytes Absolute: 0.5 x10e3/mcL (ref 0.3–1.0)
Neutrophils %: 64.7 % (ref 42.0–74.0)
Neutrophils Absolute: 4.1 x10e3/mcL (ref 1.6–7.3)
Platelets: 166 x10e3/mcL (ref 140–440)
RBC: 4.45 x10e6/mcL (ref 4.00–5.60)
RDW: 12.4 % (ref 10.0–17.0)
WBC: 6.3 x10e3/mcL (ref 3.8–10.6)

## 2023-09-26 LAB — BASIC METABOLIC PANEL
Anion Gap: 12 mmol/L (ref 2–17)
BUN: 18 mg/dL (ref 6–20)
CO2: 22 mmol/L (ref 22–29)
Calcium: 9.3 mg/dL (ref 8.5–10.7)
Chloride: 107 mmol/L (ref 98–107)
Creatinine: 1 mg/dL (ref 0.7–1.3)
Est, Glom Filt Rate: 96 mL/min/1.73mÂ² (ref >=60)
Glucose: 93 mg/dL (ref 70–99)
Osmolaliy Calculated: 283 mosm/kg (ref 270–287)
Potassium: 4.6 mmol/L (ref 3.5–5.3)
Sodium: 141 mmol/L (ref 135–145)

## 2023-09-26 MED ORDER — GABAPENTIN 100 MG PO CAPS
100 | ORAL_CAPSULE | Freq: Three times a day (TID) | ORAL | 0 refills | 30.00000 days | Status: DC
Start: 2023-09-26 — End: 2023-10-02

## 2023-09-26 MED ORDER — METHYLPREDNISOLONE 4 MG PO TBPK
4 | PACK | ORAL | 0 refills | 6.00000 days | Status: DC
Start: 2023-09-26 — End: 2023-12-22

## 2023-09-26 MED ORDER — SODIUM CHLORIDE 0.9 % IV BOLUS
0.9 | Freq: Once | INTRAVENOUS | Status: AC
Start: 2023-09-26 — End: 2023-09-26
  Administered 2023-09-26: 16:00:00 1000 mL via INTRAVENOUS

## 2023-09-26 MED ORDER — IOPAMIDOL 61 % IV SOLN
61 | Freq: Once | INTRAVENOUS | Status: AC | PRN
Start: 2023-09-26 — End: 2023-09-26
  Administered 2023-09-26: 16:00:00 100 mL via INTRAVENOUS

## 2023-09-26 NOTE — ED Provider Notes (Signed)
 [facility] EMERGENCY DEPARTMENT  EMERGENCY DEPARTMENT ENCOUNTER      Pt Name: [name]  MRN: [id number]  Birthdate [birth year]  Date of evaluation: 09/26/2023  Provider evaluation time: 09/26/23 1126  Provider: Oneil Lauth, MD    CHIEF COMPLAINT       Chief Complaint   Patient presents with    Neck Pain     Pt states taht 3 weeks ago he noticed that he had swollen/painful lymph nodes on the right side of his neck and below collar bone. States he went to urgent care  a few days later and said blood work was benign. Was given abx and he finished them. States that the lymph nodes behind the right ear running down his jaw and into his neck and shoulder is still  very painful. States only thing that made it better was muscle relaxer.          HISTORY OF PRESENT ILLNESS    3 weeks ago patient said he had large painful lymph nodes on the right side of his neck.  Seen in urgent care and took an antibiotic and some of the nodes decreased in size but he can still feel painful nodes behind the right mandible on the upper neck.  The pain radiates down to the lower anterior neck.  It also radiates towards the shoulder and upper chest.  No shortness of breath.  No weakness or numbness.          Nursing Notes were reviewed.    REVIEW OF SYSTEMS       Review of Systems   Constitutional:  Negative for chills and fever.   Respiratory:  Negative for shortness of breath.    Cardiovascular:  Negative for chest pain.   Neurological:  Negative for headaches.   All other systems reviewed and are negative.      Except as noted above the remainder of the review of systems was reviewed and negative.       PAST MEDICAL HISTORY     Past Medical History:   Diagnosis Date    Allergic rhinitis     GERD (gastroesophageal reflux disease)     Seasonal allergies     SVT (supraventricular tachycardia)        SURGICAL HISTORY       Past Surgical History:   Procedure Laterality Date    HERNIA REPAIR      TONSILLECTOMY AND  ADENOIDECTOMY      VASECTOMY Bilateral     2023.    WISDOM TOOTH EXTRACTION         CURRENT MEDICATIONS       Previous Medications    FLUTICASONE (FLONASE) 50 MCG/ACT NASAL SPRAY    1 spray in each nostril Nasally Once a day for 30 day(s)    OMEPRAZOLE  (PRILOSEC) 40 MG DELAYED RELEASE CAPSULE    TAKE ONE CAPSULE BY MOUTH ONE TIME DAILY    PROPRANOLOL  (INDERAL ) 10 MG TABLET    Take 1 tablet by mouth 3 times daily as needed (palpitations)       ALLERGIES     Sulfa antibiotics    FAMILY HISTORY       Family History   Problem Relation Age of Onset    Diabetes Father     Cancer Maternal Grandmother         SOCIAL HISTORY       Social History     Socioeconomic History    Marital status: Married  Tobacco Use    Smoking status: Former     Current packs/day: 0.00     Average packs/day: 1 pack/day for 15.0 years (15.0 ttl pk-yrs)     Types: Cigarettes     Start date: 40     Quit date: 2013     Years since quitting: 12.5    Smokeless tobacco: Never   Vaping Use    Vaping status: Never Used   Substance and Sexual Activity    Alcohol use: Yes    Drug use: Never    Sexual activity: Yes     Partners: Female       SCREENINGS         Glasgow Coma Scale  Eye Opening: Spontaneous  Best Verbal Response: Oriented  Best Motor Response: Obeys commands  Glasgow Coma Scale Score: 15                     CIWA Assessment  BP: 128/87  Pulse: 89                 PHYSICAL EXAM    (up to 7 for level 4, 8 or more for level 5)     ED Triage Vitals   BP Systolic BP Percentile Diastolic BP Percentile Temp Temp src Pulse Respirations SpO2   09/26/23 1118 -- -- 09/26/23 1119 -- 09/26/23 1118 09/26/23 1118 09/26/23 1118   128/87   97.7 F (36.5 C)  89 18 100 %      Height Weight - Scale         09/26/23 1119 09/26/23 1119         1.778 m (5' 10) 88.9 kg (196 lb)             Physical Exam  Vitals and nursing note reviewed.   Constitutional:       Appearance: Normal appearance.   HENT:      Head: Normocephalic and atraumatic.   Neck:      Comments: No  large palpable lymph nodes felt  Cardiovascular:      Rate and Rhythm: Normal rate and regular rhythm.   Pulmonary:      Effort: Pulmonary effort is normal. No respiratory distress.      Breath sounds: Normal breath sounds.   Musculoskeletal:         General: No tenderness. Normal range of motion.      Cervical back: Normal range of motion and neck supple.   Skin:     General: Skin is warm and dry.   Neurological:      General: No focal deficit present.      Mental Status: He is alert.      Sensory: No sensory deficit.      Motor: No weakness.   Psychiatric:         Mood and Affect: Mood normal.         Behavior: Behavior normal.         DIAGNOSTIC RESULTS       EMERGENCY DEPARTMENT COURSE/REASSESSMENT and MDM:   Vitals:    Vitals:    09/26/23 1118 09/26/23 1119   BP: 128/87    Pulse: 89    Resp: 18    Temp:  97.7 F (36.5 C)   SpO2: 100%    Weight:  88.9 kg (196 lb)   Height:  1.778 m (5' 10)       ED Course:    ED Course as of 09/26/23  1249   Fri Sep 26, 2023   1152 CBC is normal [MH]   1219 BMP normal [MH]      ED Course User Index  [MH] Conny Anes, MD       Medical Decision Making  Patient presents with right sided neck pain.  He believes he has an enlarged lymph node and says the pain radiates towards the anterior neck.  Pain also radiates towards his shoulder.  I will check a CBC to ensure there is no abnormal white count to suggest an infection or blood disease.  Will get a BMP to check creatinine before I do neck imaging to look for enlarged lymph nodes, infections or other concerns.    Labs and imaging are normal.  I suspect he has a pinched nerve causing some radicular pain.  I will place on steroids and gabapentin .  I will also give him information for ENT to follow-up as needed if his neck continues to hurt.      Amount and/or Complexity of Data Reviewed  Labs: ordered.  Radiology: ordered.    Risk  Prescription drug management.         CONSULTS:  None    FINAL IMPRESSION      1. Neck pain    2.  Radiculopathy, unspecified spinal region          DISPOSITION/PLAN   DISPOSITION Decision To Discharge 09/26/2023 12:48:11 PM   DISPOSITION CONDITION Stable           PATIENT REFERRED TO:  Patton Lauraine HERO, MD  [address]  [address]  [place] [address]  [phone]    In 1 week      [facility]  [address]  [place]  [address]  [phone]  In 1 week  As needed    [facility]  [address] [place]  [address]  [phone]  In 1 week  As needed      DISCHARGE MEDICATIONS:  New Prescriptions    GABAPENTIN  (NEURONTIN ) 100 MG CAPSULE    Take 1 capsule by mouth 3 times daily for 7 days. Intended supply: 30 days    METHYLPREDNISOLONE  (MEDROL , PAK,) 4 MG TABLET    Take by mouth.  Follow package instructions.     Controlled Substances Monitoring:          No data to display                (Please note that portions of this note were completed with a voice recognition program.  Efforts were made to edit the dictations but occasionally words are mis-transcribed.)    Anes Conny, MD (electronically signed)  Attending Emergency Physician           Conny Anes, MD  09/26/23 1249

## 2023-10-02 ENCOUNTER — Ambulatory Visit
Admit: 2023-10-02 | Discharge: 2023-10-02 | Payer: PRIVATE HEALTH INSURANCE | Attending: Physician Assistant | Primary: Family Medicine

## 2023-10-02 DIAGNOSIS — M5412 Radiculopathy, cervical region: Principal | ICD-10-CM

## 2023-10-02 MED ORDER — GABAPENTIN 100 MG PO CAPS
100 | ORAL_CAPSULE | Freq: Three times a day (TID) | ORAL | 0 refills | 30.00000 days | Status: DC
Start: 2023-10-02 — End: 2023-12-22

## 2023-10-02 MED ORDER — DICLOFENAC SODIUM 75 MG PO TBEC
75 | ORAL_TABLET | Freq: Two times a day (BID) | ORAL | 0 refills | 30.00000 days | Status: DC
Start: 2023-10-02 — End: 2023-10-02

## 2023-10-02 MED ORDER — DICLOFENAC SODIUM 50 MG PO TBEC
50 | ORAL_TABLET | Freq: Two times a day (BID) | ORAL | 0 refills | 30.00000 days | Status: DC
Start: 2023-10-02 — End: 2023-12-22

## 2023-10-02 NOTE — Progress Notes (Signed)
 [name]   DOB:March 23, 1980     Patient presents today with complaints of neck pain that radiates in to right shoulder and front of chest.   Symptoms presented 4 weeks ago.   How did your current pain start? Unclear.   Describes pain as numbness, pressing, aching, sharp.   Pain increases with unclear.   Pain decreases with unclear.   Pain level today is 4/10.   Patient admits numbness, tingling, radiating pain.   Patient admits recent Conservative Treatment (past 6 months) Massage Therapy.   Patient denies previous spinal surgery.    Reason for visit:   Chief Complaint   Patient presents with    New Patient    Neck Pain        History of present illness: Very kind 43 y.o. male with hx of SVT, anxiety, GERD managed with prilosec presents for evaluation of neck pain traveling the right shoulder. He was referred by the ER after a visit to both urgent care and emergency medicine for what he thought was swollen lymph nodes of the cervical region. He had CBC and CT soft tissue that ruled out cervical lymphadenopathy. He was referred to ENT, who he has already seen, who agrees he does not have any evidence of lymph node issues. He was diagnosed with TMJ and cervical neuropathic pain.     Today he reports pain of the right trapezius and scapula with more significant right sided muscular pain along the SCM muscle. He works in [employer] and admits he is often driving and on his cell phone, which he knows are both triggers for neck pain.     He states gabapenitn has been helpful since he started taking this last week and he would like a refill.     He denies radicular pain into the arms. No numbness or weakness. Not dropping objects.     He is open to PT and a course of NSAIDs sparingly due to reflux.      ROS:   A complete ROS based on the patients complaint was obtained today and appropriately  documented in the HPI.    Allergies   Allergen Reactions    Sulfa Antibiotics Rash and Hives       Past Medical History:   Diagnosis  Date    Allergic rhinitis     GERD (gastroesophageal reflux disease)     Seasonal allergies     SVT (supraventricular tachycardia)         Past Surgical History:   Procedure Laterality Date    HERNIA REPAIR      TONSILLECTOMY AND ADENOIDECTOMY      VASECTOMY Bilateral     2023.    WISDOM TOOTH EXTRACTION           Current Outpatient Medications:     gabapentin  (NEURONTIN ) 100 MG capsule, Take 1 capsule by mouth 3 times daily for 15 days. Intended supply: 30 days, Disp: 45 capsule, Rfl: 0    diclofenac  (VOLTAREN ) 50 MG EC tablet, Take 1 tablet by mouth 2 times daily for 10 days Take with meals. If GI side effects arise, stop medication, Disp: 20 tablet, Rfl: 0    methylPREDNISolone  (MEDROL , PAK,) 4 MG tablet, Take by mouth.  Follow package instructions., Disp: 1 kit, Rfl: 0    omeprazole  (PRILOSEC) 40 MG delayed release capsule, TAKE ONE CAPSULE BY MOUTH ONE TIME DAILY, Disp: 90 capsule, Rfl: 1    propranolol  (INDERAL ) 10 MG tablet, Take 1 tablet by mouth  3 times daily as needed (palpitations), Disp: 30 tablet, Rfl: 3    fluticasone (FLONASE) 50 MCG/ACT nasal spray, 1 spray in each nostril Nasally Once a day for 30 day(s), Disp: , Rfl:      Vitals:    10/02/23 1259   BP: 121/78   Pulse: 66   SpO2: 97%        Physical Exam:  Patient seen and examined   General: Well developed. Alert and cooperative in no acute distress.     HENT: atraumatic, neck supple  Pulmonary: unlabored respiratory effort  Cardiovascular:  Warm well perfused. No peripheral edema    Neurologic Exam:  Neurological:  Mental Status: Awake, alert, oriented x 4, speech clear and appropriate  Attention: Intact  Sensation: Intact to all extremities to light touch    Musculoskeletal:   Gait: normal, nonmyelopathic   Assist devices: None   Tone: normal  Spine: FROM of C spine. right SCM pain with rotation of the C spine. Right trapezius ttp.   Motor strength: 5/5 BUE       Radiological Findings:  MRI spine independently interpreted and all results shared  with the patient today. Radiologist report was also reviewed.     CT Neck with contrast: 09/26/23     COMPARISON: none     INDICATION: Neck pain/swelling,     TECHNIQUE: 3 mm contrast-enhanced axial images from the base of the brain to the   thoracic inlet with coronal and sagittal reconstructions. CT scanning was   performed using radiation dose reduction techniques when appropriate, per system   protocols     FINDINGS: The base of the brain is unremarkable. Sinuses and mastoids are clear.   Nasopharynx and parapharyngeal space appears normal.  Oropharynx and tonsils   appear normal.  The base of tongue, vallecula, and piriform sinuses appear   normal. Vocal cords are normal. No size significant adenopathy. Pulmonary apices   clear Thyroid, submandibular, and parotid glands are normal.     IMPRESSION:  1. No mass or significant adenopathy. No cellulitic change identified   Airway widely patent, epiglottis is normal.    Assessment/Plan:  1. Cervical radiculopathy    2. Muscle pain, cervical       Orders Placed This Encounter    MRI CERVICAL SPINE WO CONTRAST     Standing Status:   Future     Expected Date:   10/02/2023     Expiration Date:   12/01/2023    External Referral To Physical Therapy     Referral Priority:   Routine     Referral Type:   Eval and Treat     Referral Reason:   Specialty Services Required     Referral Location:   [facility], [place]     Requested Specialty:   Physical Therapy     Number of Visits Requested:   12    diclofenac  (VOLTAREN ) 50 MG EC tablet     Sig: Take 1 tablet by mouth 2 times daily     Dispense:  20 tablet     Refill:  0    gabapentin  (NEURONTIN ) 100 MG capsule     Sig: Take 1 capsule by mouth 3 times daily for 15 days. Intended supply: 30 days     Dispense:  45 capsule     Refill:  0     Reviewed ER and ENT notes today  Patient with classic muscular pain along the SCM, which can be associated with torticollis.  He also has right trapezius pain and  tenderness.  Recommend PT  I will refill gabapentin  and provide a short course of diclofenac   If symptoms do not resolve with PT, he will have MRI C spine completed  Return in 6 weeks, sooner if new concerns arise.   All questions answered today.        Lynda MARLA Barter, PA  [facility]

## 2023-12-22 ENCOUNTER — Ambulatory Visit
Admit: 2023-12-22 | Discharge: 2023-12-22 | Payer: PRIVATE HEALTH INSURANCE | Attending: Family Medicine | Primary: Family Medicine

## 2023-12-22 VITALS — BP 124/72 | HR 76 | Resp 18 | Ht 70.0 in | Wt 201.0 lb

## 2023-12-22 DIAGNOSIS — Z0000 Encounter for general adult medical examination without abnormal findings: Principal | ICD-10-CM

## 2023-12-22 LAB — COMPREHENSIVE METABOLIC PANEL
ALT: 17 U/L (ref 0–42)
AST: 17 U/L (ref 0–46)
Albumin/Globulin Ratio: 2.8 — ABNORMAL HIGH (ref 1.00–2.70)
Albumin: 5.1 g/dL (ref 3.5–5.2)
Alk Phosphatase: 70 U/L (ref 40–130)
Anion Gap: 10 mmol/L (ref 2–17)
BUN: 11 mg/dL (ref 6–20)
CO2: 27 mmol/L (ref 22–29)
Calcium: 9.5 mg/dL (ref 8.5–10.7)
Chloride: 103 mmol/L (ref 98–107)
Creatinine: 0.9 mg/dL (ref 0.7–1.3)
Est, Glom Filt Rate: 109 mL/min/1.73mÂ² (ref >=60)
Globulin: 1.8 g/dL — ABNORMAL LOW (ref 1.9–4.4)
Glucose: 85 mg/dL (ref 70–99)
Osmolaliy Calculated: 278 mosm/kg (ref 270–287)
Potassium: 4.5 mmol/L (ref 3.5–5.3)
Sodium: 140 mmol/L (ref 135–145)
Total Bilirubin: 0.56 mg/dL (ref 0.00–1.20)
Total Protein: 6.9 g/dL (ref 5.7–8.3)

## 2023-12-22 LAB — HEMOGLOBIN A1C
Estimated Avg Glucose: 94
Estimated Avg Glucose: 97
Hemoglobin A1C: 4.9 % (ref 4.0–6.0)

## 2023-12-22 LAB — ESTRADIOL: Estradiol: <25 pg/mL — ABNORMAL LOW (ref 25.80–60.70)

## 2023-12-22 LAB — CBC WITH AUTO DIFFERENTIAL
Basophils %: 0.8 % (ref 0.0–2.0)
Basophils Absolute: 0.1 x10e3/mcL (ref 0.0–0.2)
Eosinophils %: 1.6 % (ref 0.0–7.0)
Eosinophils Absolute: 0.1 x10e3/mcL (ref 0.0–0.5)
Hematocrit: 42.7 % (ref 38.0–52.0)
Hemoglobin: 14.7 g/dL (ref 13.0–17.3)
Immature Grans (Abs): 0.01 x10e3/mcL (ref 0.00–0.06)
Immature Granulocytes %: 0.2 % (ref 0.0–0.6)
Lymphocytes Absolute: 1.3 x10e3/mcL (ref 1.0–3.2)
Lymphocytes: 20.1 % (ref 15.0–45.0)
MCH: 32.3 pg (ref 27.0–34.5)
MCHC: 34.4 g/dL (ref 30.0–36.0)
MCV: 93.8 fL (ref 84.0–100.0)
MPV: 11.5 fL (ref 7.0–12.2)
Monocytes %: 10 % (ref 4.0–12.0)
Monocytes Absolute: 0.6 x10e3/mcL (ref 0.3–1.0)
NRBC Absolute: 0 x10e3/mcL (ref 0.000–0.012)
NRBC Automated: 0 % (ref 0.0–0.2)
Neutrophils %: 67.3 % (ref 42.0–74.0)
Neutrophils Absolute: 4.3 x10e3/mcL (ref 1.6–7.3)
Platelets: 175 x10e3/mcL (ref 140–440)
RBC: 4.55 x10e6/mcL (ref 4.00–5.60)
RDW: 12.9 % (ref 10.0–17.0)
WBC: 6.4 x10e3/mcL (ref 3.8–10.6)

## 2023-12-22 LAB — TSH REFLEX TO FT4: TSH: 0.804 u[IU]/mL (ref 0.358–3.740)

## 2023-12-22 LAB — FOLLICLE STIMULATING HORMONE: FSH: 7.02 m[IU]/mL (ref 1.50–12.40)

## 2023-12-22 LAB — LIPID PANEL
Chol/HDL Ratio: 3.3 (ref 0.0–4.4)
Cholesterol, Total: 185 mg/dL (ref 100–200)
HDL: 56 mg/dL (ref >=40)
LDL Cholesterol: 106.6 mg/dL — ABNORMAL HIGH (ref 0.0–100.0)
LDL/HDL Ratio: 1.9
Triglycerides: 112 mg/dL (ref 0–149)
VLDL: 22.4 mg/dL (ref 5.0–40.0)

## 2023-12-22 LAB — PSA SCREENING: PSA, Screening: 0.648 ng/mL (ref 0.000–4.000)

## 2023-12-22 LAB — PROLACTIN: Prolactin: 5.87 ng/mL (ref 4.04–15.20)

## 2023-12-22 LAB — LUTEINIZING HORMONE: LH: 6.04 m[IU]/mL (ref 1.14–8.75)

## 2023-12-22 LAB — TESTOSTERONE: Testosterone: 358 ng/dL (ref 249.0–836.0)

## 2023-12-22 MED ORDER — OMEPRAZOLE 40 MG PO CPDR
40 | ORAL_CAPSULE | Freq: Every day | ORAL | 1 refills | Status: AC
Start: 2023-12-22 — End: ?

## 2023-12-22 NOTE — Progress Notes (Signed)
 "Well Adult Note  Name: [name]   MRN: [id number] Sex: Male   Age: 43 y.o. Ethnicity: Non-Hispanic / Non Latino   DOB: January 03, 1981 Race: White (non-Hispanic)      [name] is here for a well adult exam.       Subjective   History:  Patient here for wellness visit.     Notes he has been eating well and exercising. Taking tirzepatide with good results.     Notes he has history of palpitations. Notes he has seen cardiology. Planning to follow-up. Notes he is taking propranolol  prn.     Review of Systems  ROS as per HPI or otherwise negative.     Allergies   Allergen Reactions    Sulfa Antibiotics Rash and Hives     Prior to Visit Medications   Medication Sig Taking? Authorizing Provider   omeprazole  (PRILOSEC) 40 MG delayed release capsule Take 1 capsule by mouth daily Yes Jaiel Saraceno, Lauraine HERO, MD   TIRZEPATIDE-WEIGHT MANAGEMENT SC Inject into the skin Yes [provider]   propranolol  (INDERAL ) 10 MG tablet Take 1 tablet by mouth 3 times daily as needed (palpitations)  Claudene Lynwood Piety, MD   fluticasone [facility]) 50 MCG/ACT nasal spray 1 spray in each nostril Nasally Once a day for 30 day(s)  [facility], MD     Past Medical History:   Diagnosis Date    Allergic rhinitis     Chronic pain 09/08/23    GERD (gastroesophageal reflux disease)     Neck pain 09/08/23    Seasonal allergies     SVT (supraventricular tachycardia)      Past Surgical History:   Procedure Laterality Date    HERNIA REPAIR      TONSILLECTOMY AND ADENOIDECTOMY      VASECTOMY Bilateral     2023.    WISDOM TOOTH EXTRACTION       Family History   Problem Relation Age of Onset    Diabetes Father     Cancer Maternal Grandmother      Social History     Tobacco Use    Smoking status: Former     Current packs/day: 0.00     Average packs/day: 1 pack/day for 15.0 years (15.0 ttl pk-yrs)     Types: Cigarettes     Start date: 03/04/1996     Quit date: 2013     Years since quitting: 12.8    Smokeless  tobacco: Never   Vaping Use    Vaping status: Never Used   Substance Use Topics    Alcohol use: Yes     Alcohol/week: 6.0 standard drinks of alcohol     Types: 6 Shots of liquor per week    Drug use: Never           Objective   Vital Signs  BP 124/72   Pulse 76   Resp 18   Ht 1.778 m (5' 10)   Wt 91.2 kg (201 lb)   SpO2 97%   BMI 28.84 kg/m     Waist 44 inches.     Wt Readings from Last 3 Encounters:   12/22/23 91.2 kg (201 lb)   10/02/23 90.8 kg (200 lb 3.2 oz)   09/26/23 88.9 kg (196 lb)     Additional Measurements    12/22/23 0840   Waist (Inches): 38.5 in      Physical Exam        GENERAL: The patient  is in no apparent distress. Alert and oriented. Vital Signs Reviewed HEENT: Head is normocephalic and atraumatic. Extraocular muscles are intact. Pupils are equal, Conjunctiva normal. Moist Mucous membranes. Posterior pharynx clear of any exudate or lesions. NECK: Supple. No Lymphadenopathy LUNGS: Clear to auscultation bilaterally. Breath Sounds equal. HEART: Regular rate and rhythm without murmur. No edema ABDOMEN: Soft, nontender, and nondistended. . Musculoskeletal: No deformity. Gait WNL. No swelling noted. NEUROLOGIC: Alert and Oriented. No focal deficits. PSYCHIATRIC: Cooperative, mood appropriate. SKIN: No rash noted.        Assessment & Plan   Encounter for well adult exam without abnormal findings  -     CBC with Auto Differential; Future  -     Comprehensive Metabolic Panel; Future  -     Lipid Panel; Future  -     TSH reflex to FT4; Future  -     Hemoglobin A1C; Future  -     Dhea; Future  -     Estradiol; Future  -     Prolactin; Future  -     Follicle Stimulating Hormone; Future  -     Luteinizing Hormone; Future  -     Testosterone; Future  GERD without esophagitis  -     omeprazole  (PRILOSEC) 40 MG delayed release capsule; Take 1 capsule by mouth daily, Disp-90 capsule, R-1Normal  Screening for malignant neoplasm of prostate  -     PSA Screening; Future      Patient doing well. Blood pressure   controlled. Check routine labs and additional labs per patient requests per his weight management provider request.  Continue current medications. Encouraged healthy eating and physical activity efforts.           Return in about 1 year (around 12/21/2024) for CPE.    "

## 2023-12-23 NOTE — Progress Notes (Signed)
"  [employer] wellness form completed,scanned into chart and faxed back   "

## 2023-12-26 LAB — DHEA: DHEA (Dehydroepiandrosterone): 182 ng/dL (ref 31–701)

## 2024-01-26 NOTE — Telephone Encounter (Signed)
On MD desk to sign

## 2024-01-26 NOTE — Telephone Encounter (Signed)
"  Patient was informed by his HR that he was given the incorrect form for Dr Orrin office to complete    Patient is emailing the correct form now that is needed to  [email or web address]      Patient needs form returned back to his employer before Thanksgiving break    Please advise - notify patient   "
